# Patient Record
Sex: Female | Born: 1950 | Race: White | Hispanic: No | Marital: Married | Smoking: Never smoker
Health system: Southern US, Community
[De-identification: ages and names within clinical notes are randomized; demographics above are authoritative.]

## PROBLEM LIST (undated history)

## (undated) DIAGNOSIS — I10 Essential (primary) hypertension: Secondary | ICD-10-CM

## (undated) DIAGNOSIS — R3129 Other microscopic hematuria: Secondary | ICD-10-CM

## (undated) DIAGNOSIS — E785 Hyperlipidemia, unspecified: Secondary | ICD-10-CM

## (undated) HISTORY — PX: HAND SURGERY: SHX662

## (undated) HISTORY — DX: Essential (primary) hypertension: I10

## (undated) HISTORY — DX: Hyperlipidemia, unspecified: E78.5

## (undated) HISTORY — PX: CHOLECYSTECTOMY: SHX55

## (undated) HISTORY — PX: TUBAL LIGATION: SHX77

## (undated) HISTORY — DX: Other microscopic hematuria: R31.29

---

## 1998-12-08 ENCOUNTER — Other Ambulatory Visit: Admission: RE | Admit: 1998-12-08 | Discharge: 1998-12-08 | Payer: Self-pay | Admitting: Family Medicine

## 2001-01-13 ENCOUNTER — Inpatient Hospital Stay (HOSPITAL_COMMUNITY): Admission: EM | Admit: 2001-01-13 | Discharge: 2001-01-14 | Payer: Self-pay | Admitting: *Deleted

## 2001-01-13 ENCOUNTER — Encounter: Payer: Self-pay | Admitting: Family Medicine

## 2001-01-14 ENCOUNTER — Encounter: Payer: Self-pay | Admitting: Internal Medicine

## 2001-04-13 ENCOUNTER — Other Ambulatory Visit: Admission: RE | Admit: 2001-04-13 | Discharge: 2001-04-13 | Payer: Self-pay | Admitting: Family Medicine

## 2006-01-02 ENCOUNTER — Other Ambulatory Visit: Admission: RE | Admit: 2006-01-02 | Discharge: 2006-01-02 | Payer: Self-pay | Admitting: Family Medicine

## 2007-05-31 ENCOUNTER — Encounter: Admission: RE | Admit: 2007-05-31 | Discharge: 2007-05-31 | Payer: Self-pay | Admitting: Family Medicine

## 2007-06-13 ENCOUNTER — Encounter: Admission: RE | Admit: 2007-06-13 | Discharge: 2007-06-13 | Payer: Self-pay | Admitting: Neurosurgery

## 2010-11-16 NOTE — H&P (Signed)
[place]. [facility]  Patient:    Marissa Leach, Marissa Leach                       MRN: [id number] Adm. Date:  [id number] Attending:  Cala Bradford CC:         Stacie [place]. Cliffton Asters, M.D., [facility]   History and Physical  DATE OF BIRTH: Jul 24, 1950  PROBLEM LIST:  1. Chest pain, rule out pulmonary emboli, rule out myocardial infarction.  2. Uncontrolled hypertension.  3. Type 2 diabetes mellitus for three years.  4. Dyslipidemia for three years.  5. Perimenopausal.  6. Obesity.  7. Status post bilateral tubal ligation in 1993.  CHIEF COMPLAINT: Chest pain.  HISTORY OF PRESENT ILLNESS: Marissa Leach is a very pleasant 60 year old female, who presents with complaint of substernal chest tightness.  The patient started developing some mid upper chest tightness yesterday about 3 p.m.  The onset of symptoms was in the context of very light exercise.  The chest tightness lasted for about 15 minutes and resolved without having to stop and rest.  The patient describes how this tightness radiated to the left side of the neck and jaw.  No diaphoresis, nausea, vomiting, or palpitations.  When Marissa Leach went to bed still she felt somewhat uncomfortable around the left shoulder.  She thought that she had pulled a muscle and that is why she did not look for medical care.  Early this morning, again she was called to go to work to cover for a co-worker.  About 6:30 a.m. while she was working she had these same symptoms, though the intensity of it was much greater.  The symptoms this time lasted about one hour.  The patient reports complete resolution of her symptoms after nitroglycerin was given.  Marissa Leach denies any previous history of similar symptoms or angina.  No previous heartburn symptoms.  She does exercise at least three to four times a week.  She works out on a treadmill and does a lot f aerobics.  She has lost about 40 pounds following a strict diet  and intensive exercise over the last three to four months.  The patient recently came back from a cruise.  She had a flight of about three hours.  She noticed bilateral ankle swelling afterward.  No fever, no chills, no shortness of breath, no cough, no PND, no rhinorrhea, no headache, no syncope.  No skin rash.  No vaginal discharge.  No urinary symptoms.  No melena, tarry stools, or bright red blood per rectum.  No night sweats. Weight loss has been reported as described previously.  PAST MEDICAL HISTORY: As per problem list.  ALLERGIES: None.  MEDICATIONS:  1. Accupril 200 mg p.o. q.d.  2. Lipitor 20 mg p.o. q.d.  3. Glucophage 1000 mg p.o. b.i.d.  SOCIAL HISTORY: The patient is married and has three children, one of which died due to a drowning accident at the age of four.  She does not smoke.  She does not drink.  She works for [employer].  FAMILY HISTORY: The patients father died at the age of 31 due to an acute MI and her mother at the age of 73, also from an acute MI.  The patients grandmother had a stroke.  No diabetes or hypertension in the family.  REVIEW OF SYSTEMS: As per HPI.  PHYSICAL EXAMINATION:  VITAL SIGNS: Temperature 98.3 degrees, blood pressure 151/87, heart rate 76, respiration  rate 20.  Oxygen saturation 99% on room air.  HEENT: Normocephalic, atraumatic.  Sclerae nonicteric.  Conjunctivae within normal limits.  PERRLA.  EOMI.  Funduscopic examination negative for papilledema or hemorrhage.  TMs within normal limits.  Oropharynx clear. Mucous membranes moist.  NECK: Supple.  No JVD, no bruits, no adenopathy, no thyromegaly.  LUNGS: Clear to auscultation bilaterally without crackles, wheezes.  Good air movement bilaterally.  CARDIAC: Irregular rate and rhythm with 1/6 systolic ejection murmur at the left lower sternal border.  No S3, no rubs.  ABDOMEN: Slightly obese, nontender, nondistended, bowel sounds present;  no hepatosplenomegaly; no rebound, no guarding, no masses, no bruits.  GU: Within normal limits.  BREAST: Within normal limits.  RECTAL: Examination not done.  EXTREMITIES: No edema, no clubbing, no cyanosis.  No cord sign.  No tenderness.  Pulses 2+ bilaterally.  NEUROLOGIC: Alert and oriented x 3.  Strength 5/5 in all extremities.  DTRs 3/5 in all extremities.  Cranial nerves 2-12 intact.  Sensory intact.  Plantar reflexes downgoing bilaterally.  LABORATORY DATA: EKG, normal sinus rhythm with ventricular rate of 87; no Q waves; no ST segment changes; poor R wave progression between V1 and V4; no previous EKG available to compare.  Chest x-ray, negative.  Troponin I less than 0.01.  CK 56, CK-MB 1.5.  Sodium 138, potassium 4.0, chloride 105, CO2 27, BUN 9, creatinine 0.6, glucose 131.  LFTs within normal limits.  Hemoglobin 13.0, MCV 33, WBC 10.7; absolute neutrophil count 8.5; platelets 378,000.  ASSESSMENT/PLAN:  1. Chest pain, rule out pulmonary embolus, rule out myocardial infarction.     Given the history of presentation, the differential diagnosis includes     pulmonary emboli, cardiac ischemic, musculoskeletal source of pain.  Other     etiologies less likely are an abdominal process like gallbladder disease.     There is no evidence of bronchospasm or pneumonia at this point by     clinical symptoms or physical examination.  The patient denies any     gastroesophageal reflux disease symptoms in the past.  First we will go     ahead and obtain a chest CT scan and lower extremity CT scan to rule out     deep vein thrombosis and pulmonary emboli.  If there is no evidence of     thromboembolic disease, a cardiac work-up will be pursued.  The patient     will be admitted regardless to a telemetry bed.  The anticoagulation     therapy will be based on the results of the CAT scan of the chest and     lower extremities.  The first sets of cardiac enzymes are completely      negative.  The electrocardiogram is a nondiagnostic one.  The patients      cardiac risk factors are significant and include diabetes, hypertension,     hyperlipidemia, and family medical history.  For this reason, cardiac     enzyme series and electrocardiograms will be obtained if the work-up for     pulmonary embolus is negative.  If the cardiac enzymes are negative, a     stress treadmill Cardiolite will be performed tomorrow morning.  In the     meantime, I will use a beta-blocker, aspirin, Plavix, nitroglycerin paste,     and oxygen for heart protection.  As described above, anticoagulation     therapy will not be used at this point.  I discussed management of this  case with Dr. Armanda Magic, who agrees with the assessment and plan.  2. Uncontrolled hypertension.  The initial blood pressure was in the mild     range of uncontrolled hypertension.  A repeat blood pressure shows an     improvement of this problem with blood pressure of 113/65.  I believe the     initial uncontrolled hypertension was likely to be associated with the     stress of the emergency room visit.  For now, we will continue the     nitroglycerin, beta-blocker, and ACE inhibitor as described above.  We     will monitor the patients blood pressure and decide about further     adjustments.  3. Type 2 diabetes mellitus.  Glucophage will be held due to the intravenous     dye protocol.  The patient reports a very good glycemic control since she     started the program of a strict diet and exercise about three to four     months ago.  For now, we will use Amaryl and sliding-scale insulin for     glycemic control.  4. Hypercholesterolemia.  Will continue statin therapy.  The liver function     tests are normal.  5. Obesity.  Although the patient has lost 40 pounds in the last three     months, this weight loss seems to be intentional by the exercise and     strict diet described in the History of Present Illness  section.  The     patient denies any night sweats, gastrointestinal, or genitourinary     symptoms.  For now we will guaiac the stools.  Although the patient seems     to be in perimenopausal phase, I believe that a work-up for malignancy is     not warranted.  We encouraged Marissa Leach to continue with the current     weight loss program since still there is plenty of room before we reach     the ideal weight for this patient. DD:  01/13/01 TD:  01/13/01 Job: [id number]

## 2011-08-29 ENCOUNTER — Ambulatory Visit (INDEPENDENT_AMBULATORY_CARE_PROVIDER_SITE_OTHER): Payer: 59 | Admitting: Family Medicine

## 2011-08-29 VITALS — BP 137/74 | HR 98 | Temp 99.7°F | Resp 16 | Ht 66.5 in | Wt 230.0 lb

## 2011-08-29 DIAGNOSIS — J019 Acute sinusitis, unspecified: Secondary | ICD-10-CM

## 2011-08-29 DIAGNOSIS — E119 Type 2 diabetes mellitus without complications: Secondary | ICD-10-CM

## 2011-08-29 DIAGNOSIS — E7800 Pure hypercholesterolemia, unspecified: Secondary | ICD-10-CM

## 2011-08-29 DIAGNOSIS — E782 Mixed hyperlipidemia: Secondary | ICD-10-CM | POA: Insufficient documentation

## 2011-08-29 DIAGNOSIS — J329 Chronic sinusitis, unspecified: Secondary | ICD-10-CM

## 2011-08-29 MED ORDER — CEFDINIR 300 MG PO CAPS: 300.0000 mg | ORAL_CAPSULE | Freq: Two times a day (BID) | ORAL | Status: AC

## 2011-08-29 NOTE — Progress Notes (Signed)
  Patient Name: Marissa Leach Date of Birth: 1951-02-22 Medical Record Number: [id number] Gender: female Date of Encounter: 08/29/2011  History of Present Illness:  Marissa Leach is a 61 y.o. very pleasant female patient who presents with the following:  Here today with illness.  Has been sick since christmas with right sided sinus and tooth pain that has never gone away. Right side nasal mucus is thick and discolored.  Low grade fever, no cough.  No ST.  No GI symptoms.  Is now off her BP meds due to weight loss- she is very pleased with her progress  Patient Active Problem List  Diagnoses  . High cholesterol  . Diabetes mellitus   Past Medical History  Diagnosis Date  . Hypertension     resolved by wt loss   Past Surgical History  Procedure Date  . Cholecystectomy   . Tubal ligation    History  Substance Use Topics  . Smoking status: Never Smoker   . Smokeless tobacco: Never Used  . Alcohol Use: No   Family History  Problem Relation Age of Onset  . Heart disease Mother   . Heart disease Father    No Known Allergies  Medication list has been reviewed and updated.  Review of Systems: As per HPI- otherwise negative.  Physical Examination: Filed Vitals:   08/29/11 1826  BP: 137/74  Pulse: 98  Temp: 99.7 F (37.6 C)  Resp: 16  Height: 5' 6.5" (1.689 m)  Weight: 230 lb (104.327 kg)    Body mass index is 36.57 kg/(m^2).  GEN: WDWN, NAD, Non-toxic, A & O x 3 HEENT: Atraumatic, Normocephalic. Neck supple. No masses, No LAD. Right frontal sinus TTP, TM wnl, oropharynx wnl Ears and Nose: No external deformity. CV: RRR, No M/G/R. No JVD. No thrill. No extra heart sounds. PULM: CTA B, no wheezes, crackles, rhonchi. No retractions. No resp. distress. No accessory muscle use. EXTR: No c/c/e NEURO Normal gait.  PSYCH: Normally interactive. Conversant. Not depressed or anxious appearing.  Calm demeanor.    Assessment and Plan: 1. Sinusitis  cefdinir (OMNICEF)  300 MG capsule  2. High cholesterol    3. Diabetes mellitus      Treat sinusitis as above. She will return for a fasting DM recheck in the next month or so.  Continuing to lose weight about one pound a week through lifestyle change- keep up the good work.   Patient (or parent if minor) instructed to return to clinic or call if not better in 3 day(s).

## 2012-02-06 ENCOUNTER — Other Ambulatory Visit: Payer: Self-pay | Admitting: Family Medicine

## 2012-02-21 ENCOUNTER — Encounter: Payer: Self-pay | Admitting: Physician Assistant

## 2012-02-21 ENCOUNTER — Ambulatory Visit (INDEPENDENT_AMBULATORY_CARE_PROVIDER_SITE_OTHER): Payer: 59 | Admitting: Physician Assistant

## 2012-02-21 VITALS — BP 142/80 | HR 84 | Temp 98.3°F | Resp 16 | Ht 65.5 in | Wt 228.2 lb

## 2012-02-21 DIAGNOSIS — E782 Mixed hyperlipidemia: Secondary | ICD-10-CM

## 2012-02-21 DIAGNOSIS — J309 Allergic rhinitis, unspecified: Secondary | ICD-10-CM | POA: Insufficient documentation

## 2012-02-21 DIAGNOSIS — Z23 Encounter for immunization: Secondary | ICD-10-CM

## 2012-02-21 DIAGNOSIS — R3129 Other microscopic hematuria: Secondary | ICD-10-CM | POA: Insufficient documentation

## 2012-02-21 DIAGNOSIS — E119 Type 2 diabetes mellitus without complications: Secondary | ICD-10-CM

## 2012-02-21 DIAGNOSIS — I10 Essential (primary) hypertension: Secondary | ICD-10-CM

## 2012-02-21 LAB — COMPREHENSIVE METABOLIC PANEL
ALT: 14 U/L (ref 0–35)
AST: 16 U/L (ref 0–37)
BUN: 19 mg/dL (ref 6–23)
Calcium: 10 mg/dL (ref 8.4–10.5)
Glucose, Bld: 300 mg/dL — ABNORMAL HIGH (ref 70–99)
Potassium: 5 mEq/L (ref 3.5–5.3)
Sodium: 136 mEq/L (ref 135–145)
Total Bilirubin: 0.5 mg/dL (ref 0.3–1.2)
Total Protein: 6.9 g/dL (ref 6.0–8.3)

## 2012-02-21 LAB — LIPID PANEL: LDL Cholesterol: 182 mg/dL — ABNORMAL HIGH (ref 0–99)

## 2012-02-21 LAB — GLUCOSE, POCT (MANUAL RESULT ENTRY): POC Glucose: 276 mg/dl — AB (ref 70–99)

## 2012-02-21 MED ORDER — SITAGLIPTIN PHOS-METFORMIN HCL 50-1000 MG PO TABS: 1.0000 | ORAL_TABLET | Freq: Every day | ORAL | Status: DC

## 2012-02-21 MED ORDER — FENOFIBRATE 145 MG PO TABS: 145.0000 mg | ORAL_TABLET | Freq: Every day | ORAL | Status: DC

## 2012-02-21 NOTE — Progress Notes (Signed)
Subjective:    Patient ID: Marissa Leach, female    DOB: [birth year], 61 y.o.   MRN: [id number]  HPI 61 y.o. year old female presents for evaluation of diabetes type 2, hyperlipidemia.  Also needs refills of Janument and Tricor. Has been very stressed x 6 months and feels like she's swollen and gained weight, and expects her glucose will be elevated today.  She reports that she was well controlled on her meds before she got stressed, so wants to continue that and "get myself back on track."  Prior to Admission medications   Medication Sig Start Date End Date Taking? Authorizing Provider  aspirin 81 MG tablet Take 160 mg by mouth 2 (two) times daily.    Yes Historical Provider, MD  Calcium Carbonate-Vitamin D (CALCIUM 600 + D PO) Take 1,200 mg by mouth daily.   Yes Historical Provider, MD  cyanocobalamin 100 MCG tablet Take 100 mcg by mouth daily. Patient not sure of dosage.   Yes Historical Provider, MD  fenofibrate (TRICOR) 145 MG tablet Take 145 mg by mouth daily.   Yes Historical Provider, MD  fish oil-omega-3 fatty acids 1000 MG capsule Take 1 g by mouth daily.   Yes Historical Provider, MD  magnesium 30 MG tablet Take by mouth daily.   Yes Historical Provider, MD  sitaGLIPtan-metformin (JANUMET) 50-1000 MG per tablet Take 1 tablet by mouth daily. NEEDS OFFICE VISIT 02/06/12  Yes Morrell Riddle, PA-C    No Known Allergies  Checks home glucose NEVER. Doesn't know if she experience symptoms with hypoglycemia. Vision gets blurry when it gets high. does not perform daily foot exam. Last dental visit was 6 months ago. Next visit is this fall. Last eye exam was within the last 1-2 years.  Gets a card to schedule. has not received a pneumococcal vaccine. is not current with influenza vaccination. Gets them intermittently at work.  Past Medical History  Diagnosis Date  . Hypertension     resolved by wt loss  . Diabetes mellitus   . Hyperlipidemia   . Microhematuria     Family History    Problem Relation Age of Onset  . Heart disease Mother   . Heart disease Father   . Diabetes Son 62    Review of Systems Denies chest pain, shortness of breath, HA, dizziness, vision change, nausea, vomiting, diarrhea, constipation, melena, hematochezia, dysuria, increased urinary urgency or frequency, increased hunger or thirst, unintentional weight change, unexplained arthralgias, rash. She describes cramping in the ankles/feet and right medial thigh cramping at night.  They are strong enough to wake him up, and are brief.     Objective:   Physical Exam  Blood pressure 142/80, pulse 84, temperature 98.3 F (36.8 C), temperature source Oral, resp. rate 16, height 5' 5.5" (1.664 m), weight 228 lb 3.2 oz (103.511 kg), SpO2 97.00%. Body mass index is 37.40 kg/(m^2). Well-developed, well nourished WF who is awake, alert and oriented, in NAD. HEENT: [place]/AT, PERRL, EOMI.  Sclera and conjunctiva are clear.  EAC are patent, TMs are normal in appearance. Nasal mucosa is pink and moist. OP is clear. Neck: supple, non-tender, no lymphadenopathy, thyromegaly. Heart: RRR, no murmur Lungs: CTA Extremities: no cyanosis, clubbing or edema. Skin: warm and dry without rash.  See diabetic foot exam.     Assessment & Plan:   1. AR (allergic rhinitis)    2. Type II or unspecified type diabetes mellitus without mention of complication, not stated as uncontrolled  POCT glucose (manual  entry), POCT glycosylated hemoglobin (Hb A1C), Comprehensive metabolic panel, Pneumococcal polysaccharide vaccine 23-valent greater than or equal to 2yo subcutaneous/IM, Microalbumin, urine, sitaGLIPtan-metformin (JANUMET) 50-1000 MG per tablet  3. Mixed hyperlipidemia  Lipid panel, fenofibrate (TRICOR) 145 MG tablet   Restart her healthy lifestyle and get back in control.  If she's not there in 3 months we'll increase the Janumet to BID.

## 2012-02-21 NOTE — Patient Instructions (Signed)
Please check your feet each day (the soles and between the toes, too!).  If you develop any areas that are red or swollen, please come in for evaluation.

## 2012-02-22 ENCOUNTER — Encounter: Payer: Self-pay | Admitting: Physician Assistant

## 2012-02-24 NOTE — Progress Notes (Signed)
Follow-up appointment made with Dr. Audria Nine for November.

## 2012-05-27 ENCOUNTER — Ambulatory Visit: Payer: 59 | Admitting: Family Medicine

## 2012-06-26 ENCOUNTER — Ambulatory Visit (INDEPENDENT_AMBULATORY_CARE_PROVIDER_SITE_OTHER): Payer: 59 | Admitting: Family Medicine

## 2012-06-26 ENCOUNTER — Encounter: Payer: Self-pay | Admitting: Family Medicine

## 2012-06-26 VITALS — BP 172/88 | HR 79 | Temp 98.2°F | Resp 18 | Ht 66.5 in | Wt 224.0 lb

## 2012-06-26 DIAGNOSIS — E1169 Type 2 diabetes mellitus with other specified complication: Secondary | ICD-10-CM

## 2012-06-26 DIAGNOSIS — IMO0001 Reserved for inherently not codable concepts without codable children: Secondary | ICD-10-CM

## 2012-06-26 DIAGNOSIS — E782 Mixed hyperlipidemia: Secondary | ICD-10-CM

## 2012-06-26 DIAGNOSIS — I10 Essential (primary) hypertension: Secondary | ICD-10-CM

## 2012-06-26 DIAGNOSIS — Z23 Encounter for immunization: Secondary | ICD-10-CM

## 2012-06-26 DIAGNOSIS — B373 Candidiasis of vulva and vagina: Secondary | ICD-10-CM

## 2012-06-26 LAB — ALT: ALT: 13 U/L (ref 0–35)

## 2012-06-26 LAB — BASIC METABOLIC PANEL
CO2: 27 mEq/L (ref 19–32)
Calcium: 9.6 mg/dL (ref 8.4–10.5)
Glucose, Bld: 422 mg/dL — ABNORMAL HIGH (ref 70–99)
Potassium: 5.1 mEq/L (ref 3.5–5.3)

## 2012-06-26 LAB — LIPID PANEL
HDL: 37 mg/dL — ABNORMAL LOW (ref >39)
Total CHOL/HDL Ratio: 7.7 Ratio

## 2012-06-26 LAB — POCT WET PREP WITH KOH: Trichomonas, UA: NEGATIVE

## 2012-06-26 MED ORDER — SITAGLIPTIN PHOS-METFORMIN HCL 50-1000 MG PO TABS: 1.0000 | ORAL_TABLET | Freq: Two times a day (BID) | ORAL | Status: DC

## 2012-06-26 MED ORDER — LISINOPRIL-HYDROCHLOROTHIAZIDE 20-12.5 MG PO TABS: 1.0000 | ORAL_TABLET | Freq: Every day | ORAL | Status: DC

## 2012-06-26 MED ORDER — FLUCONAZOLE 150 MG PO TABS: ORAL_TABLET | ORAL | Status: DC

## 2012-06-26 NOTE — Progress Notes (Signed)
S:  This 61 y.o. Cauc female has Type II DM and HTN; she was on BP meds but they were discontinued some time ago. BP was well controlled when she was taking Lisinopril-HCTZ. She does not check FSBS ("I don't have a good reason why; sometimes I just don't care"). Diabetes medication is taken once a day (had been twice a day in the past but dose was lowered for "some unknown reason").   Financial stressors have just started to subside; husband finally got a job (entry- level) so things should get better in 2014. Son gave pt a "rebounder" trampoline for Christmas; she has done one 5-minute workout and has burning and discomfort in thigh muscles today. She plans to continue w/ short workouts and gradually increase as she builds up endurance and muscle pain subsides.  Pt c/o vaginal itching and some discharge; denies odor, dysuria, abnormal bleeding or pelvic pain.  ROS: Negative for abnormal weight change, fatigue, vision disturbances, CP or tightness, palpitations, edema, SOB or DOE, cough, joint or muscle pain, HA, dizziness, numbness, weakness or syncope.  O:  Filed Vitals:   06/26/12 0917  BP: 172/88  Pulse: 79  Temp: 98.2 F (36.8 C)  Resp: 18   GEN: In NAD: WN,WE. HEENT: [place]/AT; EOMI w/ clear conj/ sclerae. EACs normal Oroph unremarkable. NECK: Supple w/o LAN or TMG. COR: RRR. Trace pedal edema. LUNGS: Normal resp rate and effort. MS: MAEs w/o difficulty; NEURO: A&O x 3; CNs intact. Nonfocal.  A1c > 14%  A/P: 1. Type II or unspecified type diabetes mellitus without mention of complication, uncontrolled  Basic metabolic panel, Lipid panel, sitaGLIPtan-metformin (JANUMET) 50-1000 MG per tablet  1 tablet twice a day w/ meals Improve nutrition and stay active  2. Candidiasis of vagina  POCT Wet Prep with KOH- Positive for Yeast   3. Combined hyperlipidemia associated with type 2 diabetes mellitus  ALT, Lipid panel  4. HTN, goal below 140/80  Resume ACEI (Lisinopril-HCTZ 20-12.5 mg 1 tab  daily)  5. Need for influenza vaccination  Flu vaccine greater than or equal to 3yo preservative free IM

## 2012-06-26 NOTE — Patient Instructions (Addendum)
1800 Calorie Diet for Diabetes Meal Planning The 1800 calorie diet is designed for eating up to 1800 calories each day. Following this diet and making healthy meal choices can help improve overall health. This diet controls blood sugar (glucose) levels and can also help lower blood pressure and cholesterol. SERVING SIZES Measuring foods and serving sizes helps to make sure you are getting the right amount of food. The list below tells how big or small some common serving sizes are:  1 oz.........4 stacked dice.  3 oz........Marland KitchenDeck of cards.  1 tsp.......Marland KitchenTip of little finger.  1 tbs......Marland KitchenMarland KitchenThumb.  2 tbs.......Marland KitchenGolf ball.   cup......Marland KitchenHalf of a fist.  1 cup.......Marland KitchenA fist. GUIDELINES FOR CHOOSING FOODS The goal of this diet is to eat a variety of foods and limit calories to 1800 each day. This can be done by choosing foods that are low in calories and fat. The diet also suggests eating small amounts of food frequently. Doing this helps control your blood glucose levels so they do not get too high or too low. Each meal or snack may include a protein food source to help you feel more satisfied and to stabilize your blood glucose. Try to eat about the same amount of food around the same time each day. This includes weekend days, travel days, and days off work. Space your meals about 4 to 5 hours apart and add a snack between them if you wish.  For example, a daily food plan could include breakfast, a morning snack, lunch, dinner, and an evening snack. Healthy meals and snacks include whole grains, vegetables, fruits, lean meats, poultry, fish, and dairy products. As you plan your meals, select a variety of foods. Choose from the bread and starch, vegetable, fruit, dairy, and meat/protein groups. Examples of foods from each group and their suggested serving sizes are listed below. Use measuring cups and spoons to become familiar with what a healthy portion looks like. Bread and Starch Each serving  equals 15 grams of carbohydrates.  1 slice bread.   bagel.   cup cold cereal (unsweetened).   cup hot cereal or mashed potatoes.  1 small potato (size of a computer mouse).   cup cooked pasta or rice.   English muffin.  1 cup broth-based soup.  3 cups of popcorn.  4 to 6 whole-wheat crackers.   cup cooked beans, peas, or corn. Vegetable Each serving equals 5 grams of carbohydrates.   cup cooked vegetables.  1 cup raw vegetables.   cup tomato or vegetable juice. Fruit Each serving equals 15 grams of carbohydrates.  1 small apple or orange.  1 cup watermelon or strawberries.   cup applesauce (no sugar added).  2 tbs raisins.   banana.   cup canned fruit, packed in water, its own juice, or sweetened with a sugar substitute.   cup unsweetened fruit juice. Dairy Each serving equals 12 to 15 grams of carbohydrates.  1 cup fat-free milk.  6 oz artificially sweetened yogurt or plain yogurt.  1 cup low-fat buttermilk.  1 cup soy milk.  1 cup almond milk. Meat/Protein  1 large egg.  2 to 3 oz meat, poultry, or fish.   cup low-fat cottage cheese.  1 tbs peanut butter.  1 oz low-fat cheese.   cup tuna in water.   cup tofu. Fat  1 tsp oil.  1 tsp trans-fat-free margarine.  1 tsp butter.  1 tsp mayonnaise.  2 tbs avocado.  1 tbs salad dressing.  1 tbs cream cheese.  2 tbs sour cream. SAMPLE 1800 CALORIE DIET PLAN Breakfast   cup unsweetened cereal (1 carb serving).  1 cup fat-free milk (1 carb serving).  1 slice whole-wheat toast (1 carb serving).   small banana (1 carb serving).  1 scrambled egg.  1 tsp trans-fat-free margarine. Lunch  Tuna sandwich.  2 slices whole-wheat bread (2 carb servings).   cup canned tuna in water, drained.  1 tbs reduced fat mayonnaise.  1 stalk celery, chopped.  2 slices tomato.  1 lettuce leaf.  1 cup carrot sticks.  24 to 30 seedless grapes (2 carb  servings).  6 oz light yogurt (1 carb serving). Afternoon Snack  3 graham cracker squares (1 carb serving).  Fat-free milk, 1 cup (1 carb serving).  1 tbs peanut butter. Dinner  3 oz salmon, broiled with 1 tsp oil.  1 cup mashed potatoes (2 carb servings) with 1 tsp trans-fat-free margarine.  1 cup fresh or frozen green beans.  1 cup steamed asparagus.  1 cup fat-free milk (1 carb serving). Evening Snack  3 cups air-popped popcorn (1 carb serving).  2 tbs parmesan cheese sprinkled on top. MEAL PLAN Use this worksheet to help you make a daily meal plan based on the 1800 calorie diet suggestions. If you are using this plan to help you control your blood glucose, you may interchange carbohydrate-containing foods (dairy, starches, and fruits). Select a variety of fresh foods of varying colors and flavors. The total amount of carbohydrate in your meals or snacks is more important than making sure you include all of the food groups every time you eat. Choose from the following foods to build your day's meals:  8 Starches.  4 Vegetables.  3 Fruits.  2 Dairy.  6 to 7 oz Meat/Protein.  Up to 4 Fats. Your dietician can use this worksheet to help you decide how many servings and which types of foods are right for you. BREAKFAST Food Group and Servings / Food Choice Starch ________________________________________________________ Dairy _________________________________________________________ Fruit _________________________________________________________ Meat/Protein __________________________________________________ Fat ___________________________________________________________ LUNCH Food Group and Servings / Food Choice Starch ________________________________________________________ Meat/Protein __________________________________________________ Vegetable _____________________________________________________ Fruit  _________________________________________________________ Dairy _________________________________________________________ Fat ___________________________________________________________ Marissa Leach Food Group and Servings / Food Choice Starch ________________________________________________________ Meat/Protein __________________________________________________ Fruit __________________________________________________________ Dairy _________________________________________________________ Marissa Leach Food Group and Servings / Food Choice Starch _________________________________________________________ Meat/Protein ___________________________________________________ Dairy __________________________________________________________ Vegetable ______________________________________________________ Fruit ___________________________________________________________ Fat ____________________________________________________________ Marissa Leach Food Group and Servings / Food Choice Fruit __________________________________________________________ Meat/Protein ___________________________________________________ Dairy __________________________________________________________ Starch _________________________________________________________ DAILY TOTALS Starch ____________________________ Vegetable _________________________ Fruit _____________________________ Dairy _____________________________ Meat/Protein______________________ Fat _______________________________ Document Released: 01/07/2005 Document Revised: 09/09/2011 Document Reviewed: 05/03/2011 ExitCare Patient Information 2013 [place], [address].    It is important that you check your blood sugar at least once a day (sometimes 2 times daily) to self manage your diabetes. It helps [place] to know what your blood sugars are when you are at home and living your day-to-day life. You need to know what your sugars are on "sick days" and before you  exercise. I have prescribed a meter with strips so you can keep track of your blood sugars and be responsible for self- monitoring.   Your next visit will be for Diabetes follow-up and your physical will be done later in 2014.

## 2012-07-02 ENCOUNTER — Encounter: Payer: Self-pay | Admitting: Family Medicine

## 2012-07-02 DIAGNOSIS — Z9189 Other specified personal risk factors, not elsewhere classified: Secondary | ICD-10-CM | POA: Insufficient documentation

## 2012-07-02 DIAGNOSIS — IMO0001 Reserved for inherently not codable concepts without codable children: Secondary | ICD-10-CM | POA: Insufficient documentation

## 2012-07-02 NOTE — Progress Notes (Signed)
Quick Note:  Please call pt and advise that the following labs are abnormal... Your blood sugar was high; we discussed better management of your Diabetes. As a result of poor control of this disease, your cholesterol and triglycerides are way above normal. Currently , you are not on medication for elevated lipids; Try to work on improving your nutrition/ food choices and getting control of Diabetes. Take all medications as prescribed and stay active with regular exercise (at least 3-4 days a week). When you return for next visit, lipid blood work will be rechecked. Your numbers 4 months ago were not as high.  Try Fish Oil capsule 1200 mg 1 capsule daily (if you do not have a fish allergy) to lower lipid levels.  Copy to pt. ______

## 2012-07-03 ENCOUNTER — Encounter: Payer: Self-pay | Admitting: Radiology

## 2012-07-04 ENCOUNTER — Encounter: Payer: Self-pay | Admitting: Radiology

## 2012-07-20 ENCOUNTER — Other Ambulatory Visit: Payer: Self-pay | Admitting: Family Medicine

## 2012-07-23 ENCOUNTER — Other Ambulatory Visit: Payer: Self-pay | Admitting: Family Medicine

## 2012-07-23 NOTE — Telephone Encounter (Signed)
Pt called stated she had  called over a week ago for med refill for yeast infection. Pt stated previously med had not totally  cleared infection. Can something else be called in, condition getting worse. Please let pt know asap. [phone] [facility] [address]

## 2012-07-23 NOTE — Telephone Encounter (Signed)
She needs to come in if that didn't take care of it. It could be something more going on.

## 2012-07-24 ENCOUNTER — Telehealth: Payer: Self-pay

## 2012-07-24 NOTE — Telephone Encounter (Signed)
She must come in for this. I have called her to advise. Advised her to come in, she is angry. She states she has been treated, I advised not normal for meds not to work and she does need to be seen. She disconnected call.

## 2012-07-24 NOTE — Telephone Encounter (Signed)
Pt states this is the forth day of calling for this medication:  fluconazole (DIFLUCAN) 150 MG tablet  bleeeeeeding from scratching so bad this is a really bad yeast infection   Needs help   [facility] [address] [phone] (H)

## 2012-07-26 ENCOUNTER — Ambulatory Visit (INDEPENDENT_AMBULATORY_CARE_PROVIDER_SITE_OTHER): Payer: 59 | Admitting: Physician Assistant

## 2012-07-26 ENCOUNTER — Telehealth: Payer: Self-pay | Admitting: *Deleted

## 2012-07-26 VITALS — BP 134/80 | HR 94 | Temp 98.7°F | Resp 16 | Ht 66.5 in | Wt 223.0 lb

## 2012-07-26 DIAGNOSIS — B373 Candidiasis of vulva and vagina: Secondary | ICD-10-CM

## 2012-07-26 DIAGNOSIS — Z01419 Encounter for gynecological examination (general) (routine) without abnormal findings: Secondary | ICD-10-CM

## 2012-07-26 DIAGNOSIS — N898 Other specified noninflammatory disorders of vagina: Secondary | ICD-10-CM

## 2012-07-26 DIAGNOSIS — L293 Anogenital pruritus, unspecified: Secondary | ICD-10-CM

## 2012-07-26 LAB — POCT WET PREP WITH KOH
Clue Cells Wet Prep HPF POC: NEGATIVE
Trichomonas, UA: NEGATIVE
Yeast Wet Prep HPF POC: NEGATIVE

## 2012-07-26 MED ORDER — FLUCONAZOLE 100 MG PO TABS: 100.0000 mg | ORAL_TABLET | Freq: Every day | ORAL | Status: DC

## 2012-07-26 MED ORDER — FLUCONAZOLE 150 MG PO TABS: 150.0000 mg | ORAL_TABLET | Freq: Once | ORAL | Status: DC

## 2012-07-26 MED ORDER — TERCONAZOLE 0.4 % VA CREA: 1.0000 | TOPICAL_CREAM | Freq: Every day | VAGINAL | Status: DC

## 2012-07-26 NOTE — Patient Instructions (Addendum)
We will treat you for 10days - if you still have symptoms in 10days please recheck - if you are symptoms free at 20 days - great - that means that the infection is successfully treated.  If you develop symptoms after 20 days please use the Diflucan 150mg  that is at the pharmacy.

## 2012-07-26 NOTE — Telephone Encounter (Signed)
error 

## 2012-07-26 NOTE — Progress Notes (Signed)
   [address], [place] [address]   Phone [phone]  Subjective:    Patient ID: Marissa Leach, female    DOB: 02-Mar-1951, 62 y.o.   MRN: [id number]  HPI  Pt presents to clinic with continued vaginal itching and irritation.  The symptoms started in Nov and she used a lot of OTC preparation without much help. She was seen in Dec and was given medication that helped but did not feel like the symptoms resolved.  Currently she has a slimy feeling in her vagina and severe external itching to the point where she is bleeding from scratching.  She has had no new sexual exposures.  She knows her DM is out of control but she does not check her glucose.  Pt also needs a PAP test at her last appt there was not time for a CPE and she would like to have it done today since we had to do a pelvic exam.  Review of Systems  Genitourinary: Positive for vaginal discharge.       Itching       Objective:   Physical Exam  Vitals reviewed. Constitutional: She is oriented to person, place, and time. She appears well-developed and well-nourished.  Pulmonary/Chest: Effort normal.  Genitourinary: Pelvic exam was performed with patient supine. No labial fusion. There is tenderness on the right labia. There is no rash, lesion or injury on the right labia. There is tenderness on the left labia. There is no rash, lesion or injury on the left labia. Vaginal discharge (thick vaginal discharge) found.       External genitalia red swollen and irritated from above her clitoris to behind her anus.  Neurological: She is alert and oriented to person, place, and time.  Skin: Skin is warm and dry.  Psychiatric: She has a normal mood and affect. Her behavior is normal. Judgment and thought content normal.   Results for orders placed in visit on 07/26/12  POCT WET PREP WITH KOH      Component Value Range   Trichomonas, UA Negative     Clue Cells Wet Prep HPF POC neg     Epithelial Wet Prep HPF POC 4-9     Yeast Wet Prep  HPF POC neg     Bacteria Wet Prep HPF POC 2+     RBC Wet Prep HPF POC 3-5     WBC Wet Prep HPF POC 4-7     KOH Prep POC Positive           Assessment & Plan:   1. Vaginal itching  POCT Wet Prep with KOH, fluconazole (DIFLUCAN) 150 MG tablet  2. Visit for gynecologic examination  Pap IG and HPV (high risk) DNA detection  3. Yeast vaginitis  fluconazole (DIFLUCAN) 100 MG tablet, terconazole (TERAZOL 7) 0.4 % vaginal cream   Due to the length of time of infection, glucose out of control will treat orally and externally with cream.  Pt to try and stop scratching.  If she is not better after 10d of treatment pt should RTC for recheck.  Pt understands and agrees with the plan.

## 2012-07-27 LAB — PAP IG AND HPV HIGH-RISK

## 2012-07-28 ENCOUNTER — Telehealth: Payer: Self-pay

## 2012-07-28 ENCOUNTER — Encounter: Payer: Self-pay | Admitting: *Deleted

## 2012-07-28 NOTE — Telephone Encounter (Signed)
Pt is calling for lab results 

## 2012-07-29 ENCOUNTER — Telehealth: Payer: Self-pay

## 2012-07-29 NOTE — Telephone Encounter (Signed)
See labs 

## 2012-07-29 NOTE — Telephone Encounter (Signed)
Called patient. Left message. See labs.

## 2012-07-29 NOTE — Telephone Encounter (Signed)
Pt called. Notified of labs.

## 2012-09-07 ENCOUNTER — Other Ambulatory Visit: Payer: Self-pay | Admitting: Physician Assistant

## 2012-09-23 ENCOUNTER — Ambulatory Visit: Payer: 59 | Admitting: Family Medicine

## 2012-10-01 ENCOUNTER — Encounter: Payer: Self-pay | Admitting: Family Medicine

## 2012-11-20 ENCOUNTER — Ambulatory Visit (INDEPENDENT_AMBULATORY_CARE_PROVIDER_SITE_OTHER): Payer: 59 | Admitting: Family Medicine

## 2012-11-20 ENCOUNTER — Encounter: Payer: Self-pay | Admitting: Family Medicine

## 2012-11-20 VITALS — BP 130/68 | HR 77 | Temp 98.0°F | Resp 16 | Ht 66.0 in | Wt 222.8 lb

## 2012-11-20 DIAGNOSIS — G4762 Sleep related leg cramps: Secondary | ICD-10-CM

## 2012-11-20 DIAGNOSIS — R5383 Other fatigue: Secondary | ICD-10-CM

## 2012-11-20 DIAGNOSIS — E559 Vitamin D deficiency, unspecified: Secondary | ICD-10-CM

## 2012-11-20 DIAGNOSIS — R5381 Other malaise: Secondary | ICD-10-CM

## 2012-11-20 DIAGNOSIS — E782 Mixed hyperlipidemia: Secondary | ICD-10-CM

## 2012-11-20 LAB — COMPREHENSIVE METABOLIC PANEL
AST: 23 U/L (ref 0–37)
Albumin: 4.2 g/dL (ref 3.5–5.2)
Alkaline Phosphatase: 46 U/L (ref 39–117)
CO2: 25 mEq/L (ref 19–32)
Glucose, Bld: 250 mg/dL — ABNORMAL HIGH (ref 70–99)
Sodium: 136 mEq/L (ref 135–145)

## 2012-11-20 LAB — LIPID PANEL
Cholesterol: 257 mg/dL — ABNORMAL HIGH (ref 0–200)
Triglycerides: 300 mg/dL — ABNORMAL HIGH (ref <150)

## 2012-11-20 LAB — POCT GLYCOSYLATED HEMOGLOBIN (HGB A1C): Hemoglobin A1C: 11.8

## 2012-11-20 MED ORDER — FENOFIBRATE 145 MG PO TABS: 145.0000 mg | ORAL_TABLET | Freq: Every day | ORAL | Status: DC

## 2012-11-20 MED ORDER — LISINOPRIL-HYDROCHLOROTHIAZIDE 20-12.5 MG PO TABS: 1.0000 | ORAL_TABLET | Freq: Every day | ORAL | Status: DC

## 2012-11-20 MED ORDER — SITAGLIPTIN PHOS-METFORMIN HCL 50-1000 MG PO TABS: ORAL_TABLET | ORAL | Status: DC

## 2012-11-20 MED ORDER — SITAGLIPTIN PHOS-METFORMIN HCL 50-1000 MG PO TABS: 1.0000 | ORAL_TABLET | Freq: Every day | ORAL | Status: DC

## 2012-11-20 NOTE — Patient Instructions (Signed)
Your Diabetes is still not well controlled. You need to take your Diabetes medication twice a day with meals. Try to eat as healthy as you can. I will see you again in 3-4 months. You have to make time to take care of yourself and stay on top of this medical problem.

## 2012-11-20 NOTE — Progress Notes (Signed)
S: This 62 y.o. Cauc female is here for Type II DM and HTN follow-up. Compliance w/ FSBS and nutrition is fair; pt works long hours (12+) at [employer] and often grabs fast food or whatever is convenient. She states the financial stress that she and her husband have been experiencing should be lessened next month since husband has full-time employment now. She denies skipping doses of medication, nocturia, polyphagia or polydipsia or hypoglycemic symptoms. Pt has not had vision evaluation within last 12 months.  She does c/o fatigue and sleep hygiene if poor; she averages <6 hours most nights. She has nocturnal leg cramps and fronts of lower legs ache, especially after long shift at work. Also c/o pain in some of digits of hands. Recalls that grandfather had "crippling arthritis" where his fingers were bent and stuck in one position.  Re: HTN- Taking medication daily; pt not checking BP at home. She denies vision changes, CP or tightness, palpitations, HA, dizziness, facial asymmetry or speech difficulty, numbness or syncope.  Re: Lipid disorder- pt not able to follow a lipid-lowering meal plan (no meal planning at all). Is taking Tricor daily. Voices no problems w/ this medication.   Patient Active Problem List   Diagnosis Date Noted  . Obesity, Class II, BMI 35-39.9, with comorbidity 07/02/2012  . Cardiovascular risk factor 07/02/2012  . AR (allergic rhinitis) 02/21/2012  . Microhematuria   . High cholesterol 08/29/2011  . Diabetes mellitus 08/29/2011    PMHx, Soc Hx and Fam Hx reviewed.   O: Filed Vitals:   11/20/12 0814  BP: 130/68  Pulse: 77  Temp: 98 F (36.7 C)  Resp: 16   GEN: In NAD. WN,WD. HENT: [place]/AT; EOMI w/ clear conj/ sclerae. Fundi difficult to examine. EACs/nose/post ph normal. NECK: No LAN or TMG. No JVD. COR: RRR. No edema. LUNGS: Normal resp rate and effort. No audible wheezes or dyspnea. SKIN: W&D; no rashes or pallor. MS: Hands- Wrists and MCP joints  normal. DIP and PIP joints w/ Heberden's nodes; minimal erythema and tenderness. Good ROM. NEURO: A&O x 3; CNs intact. Nonfocal.  PSYCH- Somewhat flat affect; calm demeanor and conversant. Speech pattern and thought content normal. Judgement intact.   Results for orders placed in visit on 11/20/12  POCT GLYCOSYLATED HEMOGLOBIN (HGB A1C)      Result Value Range   Hemoglobin A1C 11.8      A/P: Type II or unspecified type diabetes mellitus without mention of complication, uncontrolled - Healthy Lifestyle challenging w/ current work hours. Pt anticipates improvement; encouraged focus on better nutrition and sleep hygiene. Plan: Comprehensive metabolic panel, POCT glycosylated hemoglobin (Hb A1C)  Mixed hyperlipidemia - Plan: fenofibrate (TRICOR) 145 MG tablet, Lipid panel  Other malaise and fatigue - Advised sleep hygiene improvement.  Plan: TSH  Nocturnal leg cramps  Unspecified vitamin D deficiency - Plan: Vitamin D, 25-hydroxy

## 2012-11-21 LAB — VITAMIN D 25 HYDROXY (VIT D DEFICIENCY, FRACTURES): Vit D, 25-Hydroxy: 58 ng/mL (ref 30–89)

## 2012-11-23 ENCOUNTER — Other Ambulatory Visit: Payer: Self-pay | Admitting: Family Medicine

## 2012-11-23 DIAGNOSIS — E875 Hyperkalemia: Secondary | ICD-10-CM

## 2012-11-23 NOTE — Progress Notes (Signed)
Quick Note:  Please contact pt and advise that the following labs are abnormal... Blood sugar is high (we expected that given your high A1c=11.8%). Potassium is a little above normal and kidney function test suggest Diabetes is affecting the kidneys and/or your blood pressure medication may be causing a problem. Getting your Diabetes under better control is extremely important. You will need to come back in before September to recheck these labs; I want to recheck the kidney function test and potassium in 3-4 weeks.  Lipids- better than 5 months ago but still high due to poor Diabetes control. Good news- Triglycerides are 1/2 of what they were 5 months ago! Continue current medications; Lipids will be checked again in September.  Vitamin D and thyroid tests are normal.  Copy to pt. ______

## 2012-12-25 ENCOUNTER — Encounter: Payer: Self-pay | Admitting: Family Medicine

## 2012-12-25 ENCOUNTER — Ambulatory Visit (INDEPENDENT_AMBULATORY_CARE_PROVIDER_SITE_OTHER): Payer: 59 | Admitting: Family Medicine

## 2012-12-25 VITALS — BP 119/66 | HR 71 | Temp 98.5°F | Resp 16 | Ht 66.5 in | Wt 217.0 lb

## 2012-12-25 DIAGNOSIS — E875 Hyperkalemia: Secondary | ICD-10-CM

## 2012-12-25 LAB — BASIC METABOLIC PANEL
CO2: 26 mEq/L (ref 19–32)
Chloride: 100 mEq/L (ref 96–112)
Creat: 0.95 mg/dL (ref 0.50–1.10)
Glucose, Bld: 177 mg/dL — ABNORMAL HIGH (ref 70–99)

## 2012-12-25 NOTE — Patient Instructions (Addendum)
MEDICATION change-  Lisinopril- HCTZ 20- 12.5 MG   Get a pill cutter and cut this pill in half. Take 1/2 tablet every morning. Try to measure your blood pressure at home so I have some idea of the readings when you are going about your daily routine.  I will see you in September.   Have a great summer!

## 2012-12-26 ENCOUNTER — Encounter: Payer: Self-pay | Admitting: Family Medicine

## 2012-12-26 NOTE — Progress Notes (Signed)
S:  This 62 y.o. Cauc female is here to have labs repeated; previous CMET drawn in May had elevated CR and K+. Pt has changed her nutrition since last visit and has less job-related stress. She is using a dietary supplement (VEMMA) that contains botanicals, minerals and vitamins. She feels better and has lost 5-6 pounds since starting this product 2-3 weeks ago.  Patient Active Problem List   Diagnosis Date Noted  . Obesity, Class II, BMI 35-39.9, with comorbidity 07/02/2012  . Cardiovascular risk factor 07/02/2012  . AR (allergic rhinitis) 02/21/2012  . Microhematuria   . High cholesterol 08/29/2011  . Diabetes mellitus 08/29/2011    PMHx, Soc Hx and Fam Hx reviewed.  O:  Filed Vitals:   12/25/12 0947  BP: 119/66                     Weight down 6 lbs since Jan 2014  Pulse: 71  Temp: 98.5 F (36.9 C)  Resp: 16   GEN: in NAD; WN,WD.  Appears slightly fatigued (just came from work). COR: RRR. LUNGS: Normal resp rate and effort. SKIN: W&D. No erythema or pallor. MS: MAEs; no c/c/e. No deformities. NEURO: A&O x 3; CNs intact. Nonfocal.  A/P: Hyperkalemia - May 2014-  K+= 5.8 and Cr= 1.3  Plan: Basic metabolic panel Reduce BP medication (Lisinopril- HCTZ 20-12.5 mg ) to 1/2 tablet daily; monitor BP at home.

## 2013-03-26 ENCOUNTER — Ambulatory Visit: Payer: 59 | Admitting: Family Medicine

## 2013-05-06 ENCOUNTER — Other Ambulatory Visit: Payer: Self-pay

## 2013-05-26 ENCOUNTER — Ambulatory Visit: Payer: 59 | Admitting: Family Medicine

## 2013-06-10 ENCOUNTER — Encounter: Payer: Self-pay | Admitting: *Deleted

## 2013-08-20 ENCOUNTER — Ambulatory Visit: Payer: 59 | Admitting: Family Medicine

## 2013-10-01 ENCOUNTER — Encounter: Payer: Self-pay | Admitting: Family Medicine

## 2013-10-01 ENCOUNTER — Ambulatory Visit (INDEPENDENT_AMBULATORY_CARE_PROVIDER_SITE_OTHER): Payer: 59 | Admitting: Family Medicine

## 2013-10-01 VITALS — BP 130/74 | HR 75 | Temp 98.5°F | Resp 16 | Ht 65.5 in | Wt 210.0 lb

## 2013-10-01 DIAGNOSIS — E1165 Type 2 diabetes mellitus with hyperglycemia: Secondary | ICD-10-CM

## 2013-10-01 DIAGNOSIS — IMO0001 Reserved for inherently not codable concepts without codable children: Secondary | ICD-10-CM

## 2013-10-01 DIAGNOSIS — E119 Type 2 diabetes mellitus without complications: Secondary | ICD-10-CM

## 2013-10-01 DIAGNOSIS — T887XXA Unspecified adverse effect of drug or medicament, initial encounter: Secondary | ICD-10-CM

## 2013-10-01 DIAGNOSIS — E782 Mixed hyperlipidemia: Secondary | ICD-10-CM

## 2013-10-01 LAB — GLUCOSE, POCT (MANUAL RESULT ENTRY): POC Glucose: 150 mg/dl — AB (ref 70–99)

## 2013-10-01 LAB — COMPLETE METABOLIC PANEL WITH GFR
ALBUMIN: 4.4 g/dL (ref 3.5–5.2)
ALK PHOS: 48 U/L (ref 39–117)
ALT: 16 U/L (ref 0–35)
AST: 26 U/L (ref 0–37)
BILIRUBIN TOTAL: 0.6 mg/dL (ref 0.2–1.2)
BUN: 18 mg/dL (ref 6–23)
CO2: 25 mEq/L (ref 19–32)
CREATININE: 0.88 mg/dL (ref 0.50–1.10)
Calcium: 9.9 mg/dL (ref 8.4–10.5)
Chloride: 101 mEq/L (ref 96–112)
GFR, EST NON AFRICAN AMERICAN: 71 mL/min
GFR, Est African American: 81 mL/min
GLUCOSE: 149 mg/dL — AB (ref 70–99)
POTASSIUM: 4.9 meq/L (ref 3.5–5.3)
Sodium: 135 mEq/L (ref 135–145)
Total Protein: 6.7 g/dL (ref 6.0–8.3)

## 2013-10-01 LAB — POCT GLYCOSYLATED HEMOGLOBIN (HGB A1C): HEMOGLOBIN A1C: 8

## 2013-10-01 MED ORDER — ROSUVASTATIN CALCIUM 20 MG PO TABS: ORAL_TABLET | ORAL | Status: DC

## 2013-10-01 MED ORDER — ROSUVASTATIN CALCIUM 20 MG PO TABS: 20.0000 mg | ORAL_TABLET | Freq: Every day | ORAL | Status: DC

## 2013-10-01 MED ORDER — LISINOPRIL-HYDROCHLOROTHIAZIDE 20-12.5 MG PO TABS: 1.0000 | ORAL_TABLET | Freq: Every day | ORAL | Status: DC

## 2013-10-01 MED ORDER — SITAGLIPTIN PHOS-METFORMIN HCL 50-1000 MG PO TABS: ORAL_TABLET | ORAL | Status: DC

## 2013-10-01 NOTE — Progress Notes (Signed)
S:  This 63 y.o. Cauc female has Type II DM, HTN, hyperlipidemia w/ elevated CHD risk. She has been evaluated by Dr. Jacinto HalimGanji and Crestor 20 mg 1 tab daily was prescribed. Pt developed myalgias and had to discontinue the med. Muscle aches disappeared off statin. She had same problem with Lipitor years ago. She is willing to try lower dose of Crestor. She has followed nutrition guidelines provided by Dr. Jacinto HalimGanji. Blood sugars have been 110-130; no hypoglycemia. Previous A1c = 11.8% (May 2014).  Work related stressors and lost of dear friend/co-worker (hit by drunk driver) have affected pt's mental and physical well being. Extended work hours (14+) have taken a toll on her. She hopes to resume walking and better sleep since work issues are being resolved.  Patient Active Problem List   Diagnosis Date Noted  . Obesity, Class II, BMI 35-39.9, with comorbidity 07/02/2012  . Cardiovascular risk factor 07/02/2012  . AR (allergic rhinitis) 02/21/2012  . Microhematuria   . Elevated triglycerides with high cholesterol 08/29/2011  . Diabetes mellitus 08/29/2011   Prior to Admission medications   Medication Sig Start Date End Date Taking? Authorizing Provider  aspirin 81 MG tablet Take 160 mg by mouth 2 (two) times daily.    Yes Historical Provider, MD  fenofibrate (TRICOR) 145 MG tablet Take 1 tablet (145 mg total) by mouth daily. 11/20/12  Yes Maurice MarchBarbara B Namon Villarin, MD  lisinopril-hydrochlorothiazide (ZESTORETIC) 20-12.5 MG per tablet Take 1 tablet by mouth daily.   Yes Maurice MarchBarbara B Lexington Krotz, MD  OVER THE COUNTER MEDICATION Aleve taking prn   Yes Historical Provider, MD  sitaGLIPtan-metformin (JANUMET) 50-1000 MG per tablet Take 1 tablet twice a day with meals. 11/20/12  Yes Maurice MarchBarbara B Tieasha Larsen, MD  Calcium Carbonate-Vitamin D (CALCIUM 600 + D PO) Take 1,200 mg by mouth daily.    Historical Provider, MD  OVER THE COUNTER MEDICATION Krill Oil 500 mg taking    Historical Provider, MD  rosuvastatin (CRESTOR) 20 MG  tablet Take 1 tablet (20 mg total) by mouth daily. Take 1/2- 1 tablet as directed at bedtime.    Maurice MarchBarbara B Zaniel Marineau, MD  terconazole (TERAZOL 7) 0.4 % vaginal cream Place 1 applicator vaginally at bedtime. 07/26/12   Morrell RiddleSarah L Weber, PA-C   PMHx, Surg Hx, Soc and Fam Hx reviewed.  ROS: As per HPI; negative for diaphoresis, abnormal weight change, vision disturbances, CP or tightness, palpitations, edema, SOB or DOE, cough, HA, dizziness, lightheadedness, numbness, weakness or syncope.  She has had mild depressive symptoms but no agitation, confusion, behavior changes or thoughts of self harm.  O: Filed Vitals:   10/01/13 0929  BP: 130/74  Pulse: 75  Temp: 98.5 F (36.9 C)  Resp: 16   GEN: In NAD; WN,WD. HENT: [place]/AT; EOMI w/ clear conj/sclerae. Otherwise unremarkable. NECK: Supple w/o LAN or TMG.  COR: RRR. No edema. LUNGS: Normal resp rate and effort. SKIN: W&D; intact w/o diaphoresis, erythema or jaundice. MS: MAEs; no muscle tenderness or atrophy. No c/c/e. NEURO: A&O x 3; CNs intact. Nonfocal.   Results for orders placed in visit on 10/01/13  POCT GLYCOSYLATED HEMOGLOBIN (HGB A1C)      Result Value Ref Range   Hemoglobin A1C 8.0    GLUCOSE, POCT (MANUAL RESULT ENTRY)      Result Value Ref Range   POC Glucose 150 (*) 70 - 99 mg/dl    A/P: Elevated triglycerides with high cholesterol - Pt willing to try low dose statin (Crestor recommended by Cardiologist). Start w/  Crestor 20 mg  1/4 tablet hs for 2-3 weeks. If weel tolerated, increase dose to 1/2 tablet= 10 mg hs. Plan: POCT glycosylated hemoglobin (Hb A1C), POCT glucose (manual entry), COMPLETE METABOLIC PANEL WITH GFR, Lipid panel  Type II or unspecified type diabetes mellitus without mention of complication, not stated as uncontrolled- Improved control. Continue to focus on TLCs (nutrition and increased physical activity).  Medication side effect- Myalgias due to statins.  Meds ordered this encounter  Medications  .  DISCONTD: rosuvastatin (CRESTOR) 20 MG tablet    Sig: Take 1 tablet (20 mg total) by mouth daily. Take 1/2- 1 tablet as directed at bedtime.    Dispense:  90 tablet    Refill:  1  . lisinopril-hydrochlorothiazide (ZESTORETIC) 20-12.5 MG per tablet    Sig: Take 1 tablet by mouth daily.    Dispense:  90 tablet    Refill:  1  . sitaGLIPtin-metformin (JANUMET) 50-1000 MG per tablet    Sig: Take 1 tablet twice a day with meals.    Dispense:  180 tablet    Refill:  3  . rosuvastatin (CRESTOR) 20 MG tablet    Sig: Take 1/2- 1 tablet as directed at bedtime.    Dispense:  90 tablet    Refill:  1

## 2013-10-01 NOTE — Patient Instructions (Signed)
Continue all current medications as we discussed. Get back into a good walking routine and follow the heart healthy guidelines given to you by Dr. Jacinto HalimGanji. I would like to recheck your lipids in about 6-8 weeks once you have been on the lower dose of Crestor for > 4 weeks. Let me know if you do not tolerate the lower dose (take 1/4 tablet for 2 weeks then increase to 1/2 tablet if you have no problems).

## 2013-10-02 ENCOUNTER — Encounter: Payer: Self-pay | Admitting: Family Medicine

## 2013-10-04 ENCOUNTER — Encounter: Payer: Self-pay | Admitting: Family Medicine

## 2013-12-31 ENCOUNTER — Other Ambulatory Visit: Payer: Self-pay | Admitting: Family Medicine

## 2014-01-02 ENCOUNTER — Telehealth: Payer: Self-pay | Admitting: Physician Assistant

## 2014-01-02 ENCOUNTER — Ambulatory Visit (INDEPENDENT_AMBULATORY_CARE_PROVIDER_SITE_OTHER): Payer: 59 | Admitting: Physician Assistant

## 2014-01-02 VITALS — BP 114/62 | HR 98 | Temp 98.6°F | Resp 18 | Ht 66.0 in | Wt 203.0 lb

## 2014-01-02 DIAGNOSIS — E119 Type 2 diabetes mellitus without complications: Secondary | ICD-10-CM

## 2014-01-02 DIAGNOSIS — M25579 Pain in unspecified ankle and joints of unspecified foot: Secondary | ICD-10-CM | POA: Insufficient documentation

## 2014-01-02 DIAGNOSIS — M25519 Pain in unspecified shoulder: Secondary | ICD-10-CM

## 2014-01-02 DIAGNOSIS — E782 Mixed hyperlipidemia: Secondary | ICD-10-CM

## 2014-01-02 DIAGNOSIS — K644 Residual hemorrhoidal skin tags: Secondary | ICD-10-CM

## 2014-01-02 DIAGNOSIS — M25511 Pain in right shoulder: Secondary | ICD-10-CM

## 2014-01-02 DIAGNOSIS — IMO0001 Reserved for inherently not codable concepts without codable children: Secondary | ICD-10-CM

## 2014-01-02 DIAGNOSIS — E1165 Type 2 diabetes mellitus with hyperglycemia: Principal | ICD-10-CM

## 2014-01-02 DIAGNOSIS — Z1159 Encounter for screening for other viral diseases: Secondary | ICD-10-CM

## 2014-01-02 DIAGNOSIS — Z23 Encounter for immunization: Secondary | ICD-10-CM

## 2014-01-02 DIAGNOSIS — M25571 Pain in right ankle and joints of right foot: Secondary | ICD-10-CM

## 2014-01-02 LAB — COMPREHENSIVE METABOLIC PANEL
ALK PHOS: 54 U/L (ref 39–117)
ALT: 13 U/L (ref 0–35)
AST: 17 U/L (ref 0–37)
Albumin: 4.3 g/dL (ref 3.5–5.2)
BILIRUBIN TOTAL: 0.3 mg/dL (ref 0.2–1.2)
BUN: 17 mg/dL (ref 6–23)
CO2: 23 mEq/L (ref 19–32)
CREATININE: 1.13 mg/dL — AB (ref 0.50–1.10)
Calcium: 10 mg/dL (ref 8.4–10.5)
Chloride: 98 mEq/L (ref 96–112)
GLUCOSE: 310 mg/dL — AB (ref 70–99)
Potassium: 5.3 mEq/L (ref 3.5–5.3)
Sodium: 133 mEq/L — ABNORMAL LOW (ref 135–145)
Total Protein: 6.8 g/dL (ref 6.0–8.3)

## 2014-01-02 LAB — LIPID PANEL
CHOL/HDL RATIO: 6.6 ratio
Cholesterol: 218 mg/dL — ABNORMAL HIGH (ref 0–200)
HDL: 33 mg/dL — AB (ref >39)
LDL CALC: 138 mg/dL — AB (ref 0–99)
TRIGLYCERIDES: 237 mg/dL — AB (ref <150)
VLDL: 47 mg/dL — AB (ref 0–40)

## 2014-01-02 LAB — POCT GLYCOSYLATED HEMOGLOBIN (HGB A1C): Hemoglobin A1C: 10.1

## 2014-01-02 LAB — GLUCOSE, POCT (MANUAL RESULT ENTRY): POC GLUCOSE: 306 mg/dL — AB (ref 70–99)

## 2014-01-02 LAB — HEPATITIS C ANTIBODY: HCV AB: NEGATIVE

## 2014-01-02 MED ORDER — ZOSTER VACCINE LIVE 19400 UNT/0.65ML ~~LOC~~ SOLR: 0.6500 mL | Freq: Once | SUBCUTANEOUS | Status: DC

## 2014-01-02 MED ORDER — FENOFIBRATE 145 MG PO TABS: ORAL_TABLET | ORAL | Status: DC

## 2014-01-02 MED ORDER — LIDOCAINE 5 % EX OINT: 1.0000 "application " | TOPICAL_OINTMENT | CUTANEOUS | Status: DC | PRN

## 2014-01-02 MED ORDER — MELOXICAM 15 MG PO TABS: 15.0000 mg | ORAL_TABLET | Freq: Every day | ORAL | Status: DC

## 2014-01-02 NOTE — Patient Instructions (Signed)
I will contact you with your lab results as soon as they are available.   If you have not heard from me in 2 weeks, please contact me.  The fastest way to get your results is to register for My Chart (see the instructions on the last page of this printout).  I'll pull your paper chart to see when your last colonoscopy was.  Take the prescription for the Shingles Vaccine to your pharmacy.  They will administer the injection there.

## 2014-01-02 NOTE — Telephone Encounter (Signed)
This patient had a colonoscopy at [facility], but doesn't remember when. There is no report in her paper record.  Please contact [facility] and get the date of her colonoscopy and when they recommend follow up.

## 2014-01-02 NOTE — Progress Notes (Signed)
Subjective:    Patient ID: Marissa Leach, female    DOB: 07/29/1950, 63 y.o.   MRN: [id number]   PCP: No primary provider on file.  Chief Complaint  Patient presents with  . Medication Refill    Fenofibrate  . Shoulder Pain    2-3 months, right  . Ankle Pain    right  . Leg Pain    both, cramps  . Hemorrhoids    Medications, allergies, past medical history, surgical history, family history, social history and problem list reviewed and updated.  Patient Active Problem List   Diagnosis Date Noted  . Pain in joint, shoulder region 01/02/2014  . Pain in joint, ankle and foot 01/02/2014  . Obesity, Class II, BMI 35-39.9, with comorbidity 07/02/2012  . Cardiovascular risk factor 07/02/2012  . AR (allergic rhinitis) 02/21/2012  . Microhematuria   . Elevated triglycerides with high cholesterol 08/29/2011  . Diabetes mellitus type 2, controlled 08/29/2011    Prior to Admission medications   Medication Sig Start Date End Date Taking? Authorizing Provider  aspirin 81 MG tablet Take 160 mg by mouth 2 (two) times daily.    Yes Historical Provider, MD  fenofibrate (TRICOR) 145 MG tablet TAKE 1 TABLET (145 MG TOTAL) BY MOUTH DAILY. 12/31/13  Yes Eleanore E Debbra RidingEgan, PA-C  ibuprofen (ADVIL,MOTRIN) 200 MG tablet Take 200 mg by mouth daily as needed for moderate pain.   Yes Historical Provider, MD  lisinopril-hydrochlorothiazide (ZESTORETIC) 20-12.5 MG per tablet Take 1 tablet by mouth daily. 10/01/13  Yes Maurice MarchBarbara B McPherson, MD  sitaGLIPtin-metformin (JANUMET) 50-1000 MG per tablet Take 1 tablet twice a day with meals. 10/01/13  Yes Maurice MarchBarbara B McPherson, MD    HPI  Feels a hard, cone-shaped something that has come up next to a hemorrhoid that she's had for 30+ years.  Sometimes causes pain and bleeding with BM. No melena. Has had a colonoscopy at [facility], but isn't sure when.  She thinks she was told to repeat it in 10 years.  RIGHT shoulder bothering me for several months.  My arm hurts  down to my elbow.  Can't lie on that side.  Sometimes it really aches and keeps me from sleeping.  Sometimes it's just kind of there.  RIGHT ankle started hurting when she started walking so much.  Works on her feet, 8-16 hours/day, in steel toed shoes.  Periodically gets cramps in her big toes at night.  Stopped taking the Crestor recommended by cardiologist due to severe muscle aches.  Has been taking fenofibrate since then.  Has lost almost 100 lbs, and has had improved DM control. Has had some additional weight loss over the past month (10-12 pounds in the past 6 weeks), without really trying.  Notes reduced appetite, too. She's had periods of time with rapid weight loss in the past.  Review of Systems As above. No CP, SOB, dizziness, N/V.    Objective:   Physical Exam  Vitals reviewed. Constitutional: She is oriented to person, place, and time. She appears well-developed and well-nourished. No distress.  BP 114/62  Pulse 98  Temp(Src) 98.6 F (37 C)  Resp 18  Ht 5\' 6"  (1.676 m)  Wt 203 lb (92.08 kg)  BMI 32.78 kg/m2  SpO2 97%   Eyes: Conjunctivae are normal. No scleral icterus.  Neck: No thyromegaly present.  Cardiovascular: Normal rate, regular rhythm, normal heart sounds and intact distal pulses.   Pulmonary/Chest: Effort normal and breath sounds normal.  Genitourinary: Rectal exam  shows external hemorrhoid (smaller, at 6 o'clock, with small ulceration and bleeding) and tenderness.     Musculoskeletal:       Right shoulder: She exhibits tenderness. She exhibits normal range of motion and no swelling.       Right elbow: Normal.      Right wrist: Normal.       Right knee: Normal.       Right ankle: She exhibits normal range of motion and no swelling. Tenderness. Achilles tendon normal.       Left ankle: Normal. Achilles tendon normal.       Arms:      Feet:  Lymphadenopathy:    She has no cervical adenopathy.  Neurological: She is alert and oriented to person, place,  and time.  Skin: Skin is warm, dry and intact.  See diabetic foot exam.   Psychiatric: She has a normal mood and affect. Her behavior is normal.          Assessment & Plan:  1. Elevated triglycerides with high cholesterol Await lab results. - Lipid panel - fenofibrate (TRICOR) 145 MG tablet; TAKE 1 TABLET (145 MG TOTAL) BY MOUTH DAILY.  Dispense: 90 tablet; Refill: 0  2. Pain in joint, shoulder region, right 3. Pain in joint, ankle and foot, right Trial of daily NSAIDS x 2 weeks. If symptoms persist, plan xrays. - meloxicam (MOBIC) 15 MG tablet; Take 1 tablet (15 mg total) by mouth daily.  Dispense: 30 tablet; Refill: 0  4. Need for hepatitis C screening test - Hepatitis C antibody  5. Diabetes mellitus type 2, controlled Await lab results. - POCT glucose (manual entry) - POCT glycosylated hemoglobin (Hb A1C) - Comprehensive metabolic panel - Microalbumin, urine  6. Residual hemorrhoidal skin tags If symptoms persist, plan referral to [facility], though consider topical diltiazem if ulcer persists. I'll pull her paper record and see when her last colonoscopy was, and when the recommended follow-up was advised. - lidocaine (XYLOCAINE) 5 % ointment; Apply 1 application topically as needed.  Dispense: 35.44 g; Refill: 0  7. Need for shingles vaccine Patient will take prescription to the pharmacy for this injection. - zoster vaccine live, PF, (ZOSTAVAX) [id number] UNT/0.65ML injection; Inject 19,400 Units into the skin once.  Dispense: 0.65 mL; Refill: 0   Fernande Brashelle S. Kaylah Chiasson, PA-C Physician Assistant-Certified [facility]

## 2014-01-03 LAB — HM DIABETES EYE EXAM

## 2014-01-03 LAB — MICROALBUMIN, URINE: Microalb, Ur: 1.54 mg/dL (ref 0.00–1.89)

## 2014-01-03 MED ORDER — GLIPIZIDE 5 MG PO TABS: 5.0000 mg | ORAL_TABLET | Freq: Two times a day (BID) | ORAL | Status: DC

## 2014-01-03 NOTE — Telephone Encounter (Signed)
Spoke to [place] at [facility]- pt has not had any appointment with this facility.  LM for pt to rtn call.

## 2014-01-03 NOTE — Telephone Encounter (Signed)
Spoke with pt and let her know the below info and advised her to call them and see if she can figure out when her last appt was.

## 2014-01-03 NOTE — Addendum Note (Signed)
Addended by: Porfirio OarJEFFERY, Gay Moncivais S on: 01/03/2014 11:40 AM   Modules accepted: Orders

## 2014-02-04 ENCOUNTER — Encounter: Payer: 59 | Admitting: Family Medicine

## 2014-06-20 ENCOUNTER — Encounter: Payer: Self-pay | Admitting: Family Medicine

## 2014-06-25 ENCOUNTER — Other Ambulatory Visit: Payer: Self-pay | Admitting: Physician Assistant

## 2014-07-25 ENCOUNTER — Ambulatory Visit (INDEPENDENT_AMBULATORY_CARE_PROVIDER_SITE_OTHER): Payer: 59

## 2014-07-25 ENCOUNTER — Encounter: Payer: Self-pay | Admitting: Physician Assistant

## 2014-07-25 ENCOUNTER — Ambulatory Visit (INDEPENDENT_AMBULATORY_CARE_PROVIDER_SITE_OTHER): Payer: 59 | Admitting: Internal Medicine

## 2014-07-25 VITALS — BP 140/80 | HR 104 | Temp 98.6°F | Resp 18 | Ht 66.0 in | Wt 218.0 lb

## 2014-07-25 DIAGNOSIS — M25531 Pain in right wrist: Secondary | ICD-10-CM

## 2014-07-25 DIAGNOSIS — Z23 Encounter for immunization: Secondary | ICD-10-CM

## 2014-07-25 DIAGNOSIS — E1165 Type 2 diabetes mellitus with hyperglycemia: Secondary | ICD-10-CM

## 2014-07-25 DIAGNOSIS — M19031 Primary osteoarthritis, right wrist: Secondary | ICD-10-CM | POA: Insufficient documentation

## 2014-07-25 DIAGNOSIS — IMO0001 Reserved for inherently not codable concepts without codable children: Secondary | ICD-10-CM

## 2014-07-25 DIAGNOSIS — E782 Mixed hyperlipidemia: Secondary | ICD-10-CM

## 2014-07-25 LAB — COMPREHENSIVE METABOLIC PANEL
ALT: 26 U/L (ref 0–35)
AST: 21 U/L (ref 0–37)
Albumin: 4.3 g/dL (ref 3.5–5.2)
Alkaline Phosphatase: 46 U/L (ref 39–117)
BUN: 22 mg/dL (ref 6–23)
CALCIUM: 10.1 mg/dL (ref 8.4–10.5)
CO2: 24 meq/L (ref 19–32)
Chloride: 96 mEq/L (ref 96–112)
Creat: 1.11 mg/dL — ABNORMAL HIGH (ref 0.50–1.10)
Glucose, Bld: 239 mg/dL — ABNORMAL HIGH (ref 70–99)
Potassium: 5.1 mEq/L (ref 3.5–5.3)
Sodium: 135 mEq/L (ref 135–145)
Total Bilirubin: 0.4 mg/dL (ref 0.2–1.2)
Total Protein: 6.8 g/dL (ref 6.0–8.3)

## 2014-07-25 LAB — LIPID PANEL
CHOL/HDL RATIO: 7.2 ratio
CHOLESTEROL: 258 mg/dL — AB (ref 0–200)
HDL: 36 mg/dL — AB (ref >39)
LDL Cholesterol: 157 mg/dL — ABNORMAL HIGH (ref 0–99)
TRIGLYCERIDES: 325 mg/dL — AB (ref <150)
VLDL: 65 mg/dL — ABNORMAL HIGH (ref 0–40)

## 2014-07-25 LAB — POCT GLYCOSYLATED HEMOGLOBIN (HGB A1C): HEMOGLOBIN A1C: 8.8

## 2014-07-25 LAB — GLUCOSE, POCT (MANUAL RESULT ENTRY): POC Glucose: 232 mg/dl — AB (ref 70–99)

## 2014-07-25 MED ORDER — MELOXICAM 15 MG PO TABS: 15.0000 mg | ORAL_TABLET | Freq: Every day | ORAL | Status: DC

## 2014-07-25 MED ORDER — GLIPIZIDE 10 MG PO TABS: 10.0000 mg | ORAL_TABLET | Freq: Two times a day (BID) | ORAL | Status: DC

## 2014-07-25 MED ORDER — LISINOPRIL-HYDROCHLOROTHIAZIDE 20-12.5 MG PO TABS: 1.0000 | ORAL_TABLET | Freq: Every day | ORAL | Status: DC

## 2014-07-25 MED ORDER — FENOFIBRATE 145 MG PO TABS: 145.0000 mg | ORAL_TABLET | Freq: Every day | ORAL | Status: DC

## 2014-07-25 NOTE — Progress Notes (Signed)
Subjective:    Patient ID: Marissa Leach, female    DOB: 08/21/1950, 64 y.o.   MRN: [id number]   PCP: Dow AdolphMCPHERSON,BARBARA, MD  Chief Complaint  Patient presents with  . Arm Pain    Right arm wrist and shoulder pain x a few months worse yesterday  . Labs Only    Cholesterol    Allergies  Allergen Reactions  . Crestor [Rosuvastatin] Other (See Comments)    Severe muscle aches  . Lipitor [Atorvastatin]     Severe muscle and joint aches    Patient Active Problem List   Diagnosis Date Noted  . Pain in joint, shoulder region 01/02/2014  . Pain in joint, ankle and foot 01/02/2014  . Obesity, Class II, BMI 35-39.9, with comorbidity 07/02/2012  . Cardiovascular risk factor 07/02/2012  . AR (allergic rhinitis) 02/21/2012  . Microhematuria   . Elevated triglycerides with high cholesterol 08/29/2011  . Diabetes mellitus type 2, uncontrolled, without complications 08/29/2011    Prior to Admission medications   Medication Sig Start Date End Date Taking? Authorizing Provider  aspirin 81 MG tablet Take 160 mg by mouth 2 (two) times daily.    Yes Historical Provider, MD  fenofibrate (TRICOR) 145 MG tablet Take 1 tablet (145 mg total) by mouth daily. PATIENT NEEDS OFFICE VISIT FOR ADDITIONAL REFILLS 06/27/14  Yes Maurice MarchBarbara B McPherson, MD  glipiZIDE (GLUCOTROL) 5 MG tablet Take 1 tablet (5 mg total) by mouth 2 (two) times daily before a meal. 01/03/14  Yes Blaiden Werth S Latania Bascomb, PA-C  lisinopril-hydrochlorothiazide (ZESTORETIC) 20-12.5 MG per tablet Take 1 tablet by mouth daily. 10/01/13  Yes Maurice MarchBarbara B McPherson, MD  sitaGLIPtin-metformin (JANUMET) 50-1000 MG per tablet Take 1 tablet twice a day with meals. 10/01/13  Yes Maurice MarchBarbara B McPherson, MD  meloxicam (MOBIC) 15 MG tablet Take 1 tablet (15 mg total) by mouth daily. Patient not taking: Reported on 07/25/2014 01/02/14   Fernande Brashelle S Janese Radabaugh, PA-C    Medical, Surgical, Family and Social History reviewed and updated.  HPI  Presents for evaluation of  RIGHT wrist pain. Needs a refill of Tricor. She hasn't been back since her visit in July. Isn't doing well with healthy eating or exercise. Has submitted her intent to retire 09/30/2014, and plans to focus on her health after that.  Frequency of home glucose monitoring: doesn't check. Sees a dentist Q6 months (last week), eye specialist annually (next 01/04/2015). Checks feet daily. Is current with influenza vaccine. Is current with pneumococcal vaccine (will need Prevnar-13 and booster of Pneumovax-23 at age 64).  The wrist pain began several days ago while cooking. No specific trauma or injury. Pain with rotation of the wrist and flexion. She has had considerable improvement in the shoulder pain, now flares only intermittently, but has run out of the meloxicam prescribed in July. It worked well when she used it. She is RIGHT hand dominant. Notes some swelling on the RIGHT hand and wrist, and reports that the hand feels "tight" when she tries to grasp.   Review of Systems Denies chest pain, shortness of breath, HA, dizziness, vision change, nausea, vomiting, diarrhea, constipation, melena, hematochezia, dysuria, increased urinary urgency or frequency, increased hunger or thirst, unintentional weight change, other unexplained myalgias or arthralgias, rash.     Objective:   Physical Exam  Constitutional: She is oriented to person, place, and time. She appears well-developed and well-nourished. No distress.  HENT:  Head: Normocephalic and atraumatic.  Eyes: Conjunctivae are normal. No scleral icterus.  Neck: Neck  supple. No thyromegaly present.  Cardiovascular: Normal rate, regular rhythm, normal heart sounds and intact distal pulses.   Pulmonary/Chest: Effort normal and breath sounds normal.  Musculoskeletal:       Right shoulder: She exhibits tenderness (mild, superiorly and anteriorly) and pain (intermittent). She exhibits no bony tenderness, no swelling, no effusion, no crepitus, no spasm  and normal pulse.       Left shoulder: Normal.       Right elbow: Normal.      Right wrist: She exhibits decreased range of motion, tenderness, bony tenderness (tenderness is along the distal radius) and swelling (mild). She exhibits no crepitus and no deformity.       Right forearm: She exhibits tenderness, bony tenderness and swelling (mild).       Arms:      Right hand: She exhibits swelling (mild). She exhibits normal range of motion, no tenderness, no bony tenderness, normal capillary refill, no deformity and no laceration. Normal sensation noted. Normal strength noted.  Lymphadenopathy:    She has no cervical adenopathy.  Neurological: She is alert and oriented to person, place, and time. She has normal strength. No sensory deficit.  Skin: Skin is warm, dry and intact. No rash noted.  Psychiatric: She has a normal mood and affect. Her speech is normal and behavior is normal.  Vitals reviewed.  RIGHT Wrist: [facility] reading (PRIMARY) by  Dr. Perrin Maltese. Severe DJD of the first MCP.  Results for orders placed or performed in visit on 07/25/14  POCT glucose (manual entry)  Result Value Ref Range   POC Glucose 232 (A) 70 - 99 mg/dl  POCT glycosylated hemoglobin (Hb A1C)  Result Value Ref Range   Hemoglobin A1C 8.8   HM DIABETES EYE EXAM  Result Value Ref Range   HM Diabetic Eye Exam No Retinopathy No Retinopathy       Assessment & Plan:  1. Wrist pain, acute, right 6. Osteoarthritis of right wrist, unspecified osteoarthritis type Thumb spica splint. Referral to ortho. Meloxicam PRN. She notes considerable improvement with placement of the splint in the office. - Ambulatory referral to Orthopedic Surgery - meloxicam (MOBIC) 15 MG tablet; Take 1 tablet (15 mg total) by mouth daily.  Dispense: 30 tablet; Refill: 0 - DG Wrist Complete Right; Future  2. Diabetes mellitus type 2, uncontrolled, without complications Uncontrolled. Increase glipizide from 5 mg BID to 10 mg BID. Increase  efforts for healthy eating and increase exercise. RTC 3 months for recheck and fasting labs. - POCT glucose (manual entry) - POCT glycosylated hemoglobin (Hb A1C) - Comprehensive metabolic panel - lisinopril-hydrochlorothiazide (ZESTORETIC) 20-12.5 MG per tablet; Take 1 tablet by mouth daily.  Dispense: 90 tablet; Refill: 1 - glipiZIDE (GLUCOTROL) 10 MG tablet; Take 1 tablet (10 mg total) by mouth 2 (two) times daily before a meal.  Dispense: 180 tablet; Refill: 3  3. Elevated triglycerides with high cholesterol Not fasting today. Expect she'll need additional agent. As she is intolerant of two statins, consider Niacin or Zetia. - Lipid panel - fenofibrate (TRICOR) 145 MG tablet; Take 1 tablet (145 mg total) by mouth daily. PATIENT NEEDS OFFICE VISIT FOR ADDITIONAL REFILLS  Dispense: 30 tablet; Refill: 0  4. Obesity, Class II, BMI 35-39.9, with comorbidity See above.  5. Need for Tdap vaccination - Tdap vaccine greater than or equal to 7yo IM     Fernande Bras, PA-C Physician Assistant-Certified [facility]

## 2014-07-25 NOTE — Patient Instructions (Addendum)
Wear the splint as much as you can.  It's for comfort, so if you need to remove it for work, that's ok. The orthopedics office will contact you directly. Use the meloxicam as needed for pain in the wrist and shoulder.  Healthy eating and regular exercise are critical. Please increase the glipizide to 10 mg twice daily (you may use up the 5 mg tablets that you have by taking 2 of them twice each day, then start the new prescription for the 10 mg tablets). When you come in next time, please make sure that you are fasting for the labs.

## 2014-07-27 ENCOUNTER — Telehealth: Payer: Self-pay | Admitting: Physician Assistant

## 2014-07-27 ENCOUNTER — Telehealth: Payer: Self-pay

## 2014-07-27 DIAGNOSIS — E782 Mixed hyperlipidemia: Secondary | ICD-10-CM

## 2014-07-27 MED ORDER — FENOFIBRATE 145 MG PO TABS: 145.0000 mg | ORAL_TABLET | Freq: Every day | ORAL | Status: DC

## 2014-07-27 NOTE — Telephone Encounter (Signed)
When this medication was refilled at the recent visit, it was for #30 with a note that she needed OV for additional refills. It should have been changed to #90, RF x 1.  Meds ordered this encounter  Medications  . fenofibrate (TRICOR) 145 MG tablet    Sig: Take 1 tablet (145 mg total) by mouth daily.    Dispense:  90 tablet    Refill:  1    Order Specific Question:  Supervising Provider    Answer:  DOOLITTLE, ROBERT P [[id number]]

## 2014-08-16 ENCOUNTER — Encounter: Payer: Self-pay | Admitting: Family Medicine

## 2014-10-22 ENCOUNTER — Encounter: Payer: Self-pay | Admitting: Urgent Care

## 2014-10-22 ENCOUNTER — Ambulatory Visit (INDEPENDENT_AMBULATORY_CARE_PROVIDER_SITE_OTHER): Payer: 59

## 2014-10-22 ENCOUNTER — Ambulatory Visit (INDEPENDENT_AMBULATORY_CARE_PROVIDER_SITE_OTHER): Payer: 59 | Admitting: Emergency Medicine

## 2014-10-22 VITALS — BP 144/72 | HR 96 | Temp 98.2°F | Ht 66.0 in | Wt 220.8 lb

## 2014-10-22 DIAGNOSIS — M7541 Impingement syndrome of right shoulder: Secondary | ICD-10-CM | POA: Diagnosis not present

## 2014-10-22 DIAGNOSIS — E782 Mixed hyperlipidemia: Secondary | ICD-10-CM | POA: Diagnosis not present

## 2014-10-22 DIAGNOSIS — M25531 Pain in right wrist: Secondary | ICD-10-CM

## 2014-10-22 DIAGNOSIS — M25511 Pain in right shoulder: Secondary | ICD-10-CM

## 2014-10-22 DIAGNOSIS — E1165 Type 2 diabetes mellitus with hyperglycemia: Secondary | ICD-10-CM

## 2014-10-22 DIAGNOSIS — M19031 Primary osteoarthritis, right wrist: Secondary | ICD-10-CM

## 2014-10-22 DIAGNOSIS — IMO0001 Reserved for inherently not codable concepts without codable children: Secondary | ICD-10-CM

## 2014-10-22 LAB — POCT GLYCOSYLATED HEMOGLOBIN (HGB A1C): Hemoglobin A1C: 11.5

## 2014-10-22 LAB — COMPLETE METABOLIC PANEL WITH GFR
ALBUMIN: 4.3 g/dL (ref 3.5–5.2)
ALT: 13 U/L (ref 0–35)
AST: 17 U/L (ref 0–37)
Alkaline Phosphatase: 48 U/L (ref 39–117)
BUN: 22 mg/dL (ref 6–23)
CO2: 25 mEq/L (ref 19–32)
Calcium: 10.1 mg/dL (ref 8.4–10.5)
Chloride: 98 mEq/L (ref 96–112)
Creat: 1.08 mg/dL (ref 0.50–1.10)
GFR, EST AFRICAN AMERICAN: 63 mL/min
GFR, EST NON AFRICAN AMERICAN: 55 mL/min — AB
GLUCOSE: 309 mg/dL — AB (ref 70–99)
Potassium: 5.9 mEq/L — ABNORMAL HIGH (ref 3.5–5.3)
SODIUM: 133 meq/L — AB (ref 135–145)
Total Bilirubin: 0.5 mg/dL (ref 0.2–1.2)
Total Protein: 7 g/dL (ref 6.0–8.3)

## 2014-10-22 LAB — LIPID PANEL
CHOLESTEROL: 265 mg/dL — AB (ref 0–200)
HDL: 28 mg/dL — AB (ref >=46)
Total CHOL/HDL Ratio: 9.5 Ratio
Triglycerides: 411 mg/dL — ABNORMAL HIGH (ref <150)

## 2014-10-22 LAB — GLUCOSE, POCT (MANUAL RESULT ENTRY): POC GLUCOSE: 296 mg/dL — AB (ref 70–99)

## 2014-10-22 MED ORDER — BLOOD GLUCOSE MONITOR KIT: PACK | Status: DC

## 2014-10-22 NOTE — Progress Notes (Signed)
MRN: [id number] DOB: 03/16/1951  Subjective:   Clearnce SorrelDonna A Kuenzi is a 64 y.o. female presenting for chief complaint of Follow-up; Labs; and Shoulder Pain  DM - managed glipizide, sitagliptin-metformin. Does not check blood sugar at home, has been changing diet and starting to exercise. Denies chest pain, shob, n/v, abdominal pain, weakness, numbness and tingling, foot infections/ulcers/wounds, blurred vision, polyphagia, polydipsia, polyuria. Denies smoking or alcohol use.  HL - taking fenofibrate and krill oil. Diet and exercise as above. ROS as above.  Shoulder - reports several month history of right shoulder pain, pain is achy in nature with sharp intermittent pains, sometimes radiates down her arm, worse with repetitive movements, lifting and moving arm above 90 degrees. She also has decreased ROM. Patient is right handed. 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 She has tried Tylenol with minimal relief of her pain and has also used meloxicam intermittently. Denies fevers, swelling, erythema, history of neck injury, shoulder injury, neck or shoulder surgery. Of note, patient is also having wrist pain, previously evaluated 07/2014 and sent to orthopedics where she was diagnosed with osteoarthritis and DeQuervain's tenosynovitis. She continues to have daily wrist pain, limited ROM. Wears wrist brace daily, wrist splint at night. Admits that both her shoulder pain and wrist pain make it extremely difficult for her to work and also wakes her up from her sleep. Patient is currently planning retirement. 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 She is worried that she will lose her retirement funds which she has earned over a period of 29 years. States that there are current legal battles and negotiations regarding mass lay offs.  Denies any other aggravating or relieving factors, no  other questions or concerns.  Lupita LeashDonna has a current medication list which includes the following prescription(s): aspirin, co q-10, fenofibrate, glipizide, krill oil, lisinopril-hydrochlorothiazide, sitagliptin-metformin, and meloxicam. She is allergic to crestor and lipitor.  Lupita LeashDonna  has a past medical history of Hypertension; Diabetes mellitus; Hyperlipidemia; and Microhematuria. Also  has past surgical history that includes Cholecystectomy and Tubal ligation.  ROS As in subjective.  Objective:   Vitals: BP 144/72 mmHg  Pulse 96  Temp(Src) 98.2 F (36.8 C) (Oral)  Ht 5\' 6"  (1.676 m)  Wt 220 lb 12.8 oz (100.154 kg)  BMI 35.65 kg/m2  SpO2 97%  Wt Readings from Last 3 Encounters:  10/22/14 220 lb 12.8 oz (100.154 kg)  07/25/14 218 lb (98.884 kg)  01/02/14 203 lb (92.08 kg)   Physical Exam  Constitutional: She is well-developed, well-nourished, and in no distress.  HENT:  Mouth/Throat: Oropharynx is clear and moist. No oropharyngeal exudate.  Neck: Neck supple.  Cardiovascular: Normal rate, regular rhythm and intact distal pulses.  Exam reveals no gallop and no friction rub.   No murmur heard. Pulmonary/Chest: No stridor. No respiratory distress. She has no wheezes. She has no rales. She exhibits no tenderness.  Abdominal: She exhibits no distension and no mass. There is no tenderness.  Musculoskeletal:       Right shoulder: She exhibits decreased range of motion (Decreased external rotation, flexion and extension, +Hawkins and Neer test) and tenderness (mild tenderness over acromion process). She exhibits no swelling, no effusion, no crepitus, no deformity, no laceration and no spasm.       Right wrist: She exhibits decreased range of motion (passive and active), tenderness (over radial aspect of wrist), bony tenderness  and swelling (trace edema). She exhibits no effusion, no crepitus, no deformity and no laceration.       Cervical back: She exhibits normal range of motion, no  tenderness (Negative Spurling maneuver), no bony tenderness, no swelling, no edema, no deformity, no laceration and no spasm.  Strength 5/5, DTR intact  Lymphadenopathy:    She has no cervical adenopathy.  Skin: Skin is warm and dry. No rash noted. No erythema. No pallor.   [facility] reading (PRIMARY) by  Dr. Cleta Alberts and PA-Editha Bridgeforth. Shoulder (Right): possible cyst at the lateral humeral head (please comment), otherwise no acute or degenerative findings. Wrist (Right): significant degenerative changes at first MCP.  Diabetic Foot Exam - Simple   Simple Foot Form  Diabetic Foot exam was performed with the following findings:  Yes 10/22/2014  9:04 AM  Visual Inspection  No deformities, no ulcerations, no other skin breakdown bilaterally:  Yes  Sensation Testing  Intact to touch and monofilament testing bilaterally:  Yes  Pulse Check  Posterior Tibialis and Dorsalis pulse intact bilaterally:  Yes  Comments     Results for orders placed or performed in visit on 10/22/14 (from the past 24 hour(s))  POCT glycosylated hemoglobin (Hb A1C)     Status: None   Collection Time: 10/22/14  9:24 AM  Result Value Ref Range   Hemoglobin A1C 11.5    Assessment and Plan :   1. Diabetes mellitus type 2, uncontrolled, without complications - Uncontrolled, patient is currently asymptomatic, counseled on risks of severely uncontrolled diabetes and need for basal insulin since she is already on 3 diabetic medications. Patient was resistant to this idea. Wants to continue with dietary modifications. I advised that she needs to start insulin and cannot count on diet and exercise alone. Patient agreed to pick up glucometer today and will follow up with me by phone tomorrow on how to proceed with medical management of her diabetes. Consider referral to endocrinologist.  2. Mixed hyperlipidemia - Uncontrolled, labs pending, has had dietary non-compliance prior to the last month. Advised continued diet and exercise  modifications. Continue Krill oil, will consider restarting lower dose of statin therapy to help with cholesterol instead of fenofibrate.  3. Osteoarthritis of right wrist, unspecified osteoarthritis type 4. Right wrist pain 5. Pain in joint, shoulder region, right 6. Shoulder impingement syndrome, right - Significant OA of the wrist, explained to patient extensively that I cannot write her out of work in the future. Patient agreed to go through the appropriate channels for management of her OA and shoulder pain. Advised that we refer her urgently to [facility]. Consider surgical intervention. Empathized with patient regarding her work situation and possible loss of retirement funds. Reiterated that I cannot compromise medical therapy to fit that need. Patient verbalized understanding.  Wallis Bamberg, PA-C [facility] [phone] 10/22/2014 8:17 AM

## 2014-10-22 NOTE — Patient Instructions (Addendum)
- 5 sets of 10 repetitions for strength exercises, 5 sets of 10 seconds for stretches - You may use ice pads alternating with heat pads for inflammation and pain relief - You may also use Tylenol or meloxicam 1/2 tablet of 15mg  per day for inflammation and pain relief  Impingement Syndrome, Rotator Cuff, Bursitis with Rehab Impingement syndrome is a condition that involves inflammation of the tendons of the rotator cuff and the subacromial bursa, that causes pain in the shoulder. The rotator cuff consists of four tendons and muscles that control much of the shoulder and upper arm function. The subacromial bursa is a fluid filled sac that helps reduce friction between the rotator cuff and one of the bones of the shoulder (acromion). Impingement syndrome is usually an overuse injury that causes swelling of the bursa (bursitis), swelling of the tendon (tendonitis), and/or a tear of the tendon (strain). Strains are classified into three categories. Grade 1 strains cause pain, but the tendon is not lengthened. Grade 2 strains include a lengthened ligament, due to the ligament being stretched or partially ruptured. With grade 2 strains there is still function, although the function may be decreased. Grade 3 strains include a complete tear of the tendon or muscle, and function is usually impaired. SYMPTOMS   Pain around the shoulder, often at the outer portion of the upper arm.  Pain that gets worse with shoulder function, especially when reaching overhead or lifting.  Sometimes, aching when not using the arm.  Pain that wakes you up at night.  Sometimes, tenderness, swelling, warmth, or redness over the affected area.  Loss of strength.  Limited motion of the shoulder, especially reaching behind the back (to the back pocket or to unhook bra) or across your body.  Crackling sound (crepitation) when moving the arm.  Biceps tendon pain and inflammation (in the front of the shoulder). Worse when  bending the elbow or lifting. CAUSES  Impingement syndrome is often an overuse injury, in which chronic (repetitive) motions cause the tendons or bursa to become inflamed. A strain occurs when a force is paced on the tendon or muscle that is greater than it can withstand. Common mechanisms of injury include: Stress from sudden increase in duration, frequency, or intensity of training.  Direct hit (trauma) to the shoulder.  Aging, erosion of the tendon with normal use.  Bony bump on shoulder (acromial spur). RISK INCREASES WITH:  Contact sports (football, wrestling, boxing).  Throwing sports (baseball, tennis, volleyball).  Weightlifting and bodybuilding.  Heavy labor.  Previous injury to the rotator cuff, including impingement.  Poor shoulder strength and flexibility.  Failure to warm up properly before activity.  Inadequate protective equipment.  Old age.  Bony bump on shoulder (acromial spur). PREVENTION   Warm up and stretch properly before activity.  Allow for adequate recovery between workouts.  Maintain physical fitness:  Strength, flexibility, and endurance.  Cardiovascular fitness.  Learn and use proper exercise technique. PROGNOSIS  If treated properly, impingement syndrome usually goes away within 6 weeks. Sometimes surgery is required.  RELATED COMPLICATIONS   Longer healing time if not properly treated, or if not given enough time to heal.  Recurring symptoms, that result in a chronic condition.  Shoulder stiffness, frozen shoulder, or loss of motion.  Rotator cuff tendon tear.  Recurring symptoms, especially if activity is resumed too soon, with overuse, with a direct blow, or when using poor technique. TREATMENT  Treatment first involves the use of ice and medicine, to reduce pain  and inflammation. The use of strengthening and stretching exercises may help reduce pain with activity. These exercises may be performed at home or with a therapist.  If non-surgical treatment is unsuccessful after more than 6 months, surgery may be advised. After surgery and rehabilitation, activity is usually possible in 3 months.  MEDICATION  If pain medicine is needed, nonsteroidal anti-inflammatory medicines (aspirin and ibuprofen), or other minor pain relievers (acetaminophen), are often advised.  Do not take pain medicine for 7 days before surgery.  Prescription pain relievers may be given, if your caregiver thinks they are needed. Use only as directed and only as much as you need.  Corticosteroid injections may be given by your caregiver. These injections should be reserved for the most serious cases, because they may only be given a certain number of times. HEAT AND COLD  Cold treatment (icing) should be applied for 10 to 15 minutes every 2 to 3 hours for inflammation and pain, and immediately after activity that aggravates your symptoms. Use ice packs or an ice massage.  Heat treatment may be used before performing stretching and strengthening activities prescribed by your caregiver, physical therapist, or athletic trainer. Use a heat pack or a warm water soak. SEEK MEDICAL CARE IF:   Symptoms get worse or do not improve in 4 to 6 weeks, despite treatment.  New, unexplained symptoms develop. (Drugs used in treatment may produce side effects.) EXERCISES  RANGE OF MOTION (ROM) AND STRETCHING EXERCISES - Impingement Syndrome (Rotator Cuff  Tendinitis, Bursitis) These exercises may help you when beginning to rehabilitate your injury. Your symptoms may go away with or without further involvement from your physician, physical therapist or athletic trainer. While completing these exercises, remember:   Restoring tissue flexibility helps normal motion to return to the joints. This allows healthier, less painful movement and activity.  An effective stretch should be held for at least 30 seconds.  A stretch should never be painful. You should only  feel a gentle lengthening or release in the stretched tissue. STRETCH - Flexion, Standing  Stand with good posture. With an underhand grip on your right / left hand, and an overhand grip on the opposite hand, grasp a broomstick or cane so that your hands are a little more than shoulder width apart.  Keeping your right / left elbow straight and shoulder muscles relaxed, push the stick with your opposite hand, to raise your right / left arm in front of your body and then overhead. Raise your arm until you feel a stretch in your right / left shoulder, but before you have increased shoulder pain.  Try to avoid shrugging your right / left shoulder as your arm rises, by keeping your shoulder blade tucked down and toward your mid-back spine. Hold for __________ seconds.  Slowly return to the starting position. Repeat __________ times. Complete this exercise __________ times per day. STRETCH - Abduction, Supine  Lie on your back. With an underhand grip on your right / left hand and an overhand grip on the opposite hand, grasp a broomstick or cane so that your hands are a little more than shoulder width apart.  Keeping your right / left elbow straight and your shoulder muscles relaxed, push the stick with your opposite hand, to raise your right / left arm out to the side of your body and then overhead. Raise your arm until you feel a stretch in your right / left shoulder, but before you have increased shoulder pain.  Try to avoid  shrugging your right / left shoulder as your arm rises, by keeping your shoulder blade tucked down and toward your mid-back spine. Hold for __________ seconds.  Slowly return to the starting position. Repeat __________ times. Complete this exercise __________ times per day. ROM - Flexion, Active-Assisted  Lie on your back. You may bend your knees for comfort.  Grasp a broomstick or cane so your hands are about shoulder width apart. Your right / left hand should grip the end  of the stick, so that your hand is positioned "thumbs-up," as if you were about to shake hands.  Using your healthy arm to lead, raise your right / left arm overhead, until you feel a gentle stretch in your shoulder. Hold for __________ seconds.  Use the stick to assist in returning your right / left arm to its starting position. Repeat __________ times. Complete this exercise __________ times per day.  ROM - Internal Rotation, Supine   Lie on your back on a firm surface. Place your right / left elbow about 60 degrees away from your side. Elevate your elbow with a folded towel, so that the elbow and shoulder are the same height.  Using a broomstick or cane and your strong arm, pull your right / left hand toward your body until you feel a gentle stretch, but no increase in your shoulder pain. Keep your shoulder and elbow in place throughout the exercise.  Hold for __________ seconds. Slowly return to the starting position. Repeat __________ times. Complete this exercise __________ times per day. STRETCH - Internal Rotation  Place your right / left hand behind your back, palm up.  Throw a towel or belt over your opposite shoulder. Grasp the towel with your right / left hand.  While keeping an upright posture, gently pull up on the towel, until you feel a stretch in the front of your right / left shoulder.  Avoid shrugging your right / left shoulder as your arm rises, by keeping your shoulder blade tucked down and toward your mid-back spine.  Hold for __________ seconds. Release the stretch, by lowering your healthy hand. Repeat __________ times. Complete this exercise __________ times per day. ROM - Internal Rotation   Using an underhand grip, grasp a stick behind your back with both hands.  While standing upright with good posture, slide the stick up your back until you feel a mild stretch in the front of your shoulder.  Hold for __________ seconds. Slowly return to your starting  position. Repeat __________ times. Complete this exercise __________ times per day.  STRETCH - Posterior Shoulder Capsule   Stand or sit with good posture. Grasp your right / left elbow and draw it across your chest, keeping it at the same height as your shoulder.  Pull your elbow, so your upper arm comes in closer to your chest. Pull until you feel a gentle stretch in the back of your shoulder.  Hold for __________ seconds. Repeat __________ times. Complete this exercise __________ times per day. STRENGTHENING EXERCISES - Impingement Syndrome (Rotator Cuff Tendinitis, Bursitis) These exercises may help you when beginning to rehabilitate your injury. They may resolve your symptoms with or without further involvement from your physician, physical therapist or athletic trainer. While completing these exercises, remember:  Muscles can gain both the endurance and the strength needed for everyday activities through controlled exercises.  Complete these exercises as instructed by your physician, physical therapist or athletic trainer. Increase the resistance and repetitions only as guided.  You may  experience muscle soreness or fatigue, but the pain or discomfort you are trying to eliminate should never worsen during these exercises. If this pain does get worse, stop and make sure you are following the directions exactly. If the pain is still present after adjustments, discontinue the exercise until you can discuss the trouble with your clinician.  During your recovery, avoid activity or exercises which involve actions that place your injured hand or elbow above your head or behind your back or head. These positions stress the tissues which you are trying to heal. STRENGTH - Scapular Depression and Adduction   With good posture, sit on a firm chair. Support your arms in front of you, with pillows, arm rests, or on a table top. Have your elbows in line with the sides of your body.  Gently draw your  shoulder blades down and toward your mid-back spine. Gradually increase the tension, without tensing the muscles along the top of your shoulders and the back of your neck.  Hold for __________ seconds. Slowly release the tension and relax your muscles completely before starting the next repetition.  After you have practiced this exercise, remove the arm support and complete the exercise in standing as well as sitting position. Repeat __________ times. Complete this exercise __________ times per day.  STRENGTH - Shoulder Abductors, Isometric  With good posture, stand or sit about 4-6 inches from a wall, with your right / left side facing the wall.  Bend your right / left elbow. Gently press your right / left elbow into the wall. Increase the pressure gradually, until you are pressing as hard as you can, without shrugging your shoulder or increasing any shoulder discomfort.  Hold for __________ seconds.  Release the tension slowly. Relax your shoulder muscles completely before you begin the next repetition. Repeat __________ times. Complete this exercise __________ times per day.  STRENGTH - External Rotators, Isometric  Keep your right / left elbow at your side and bend it 90 degrees.  Step into a door frame so that the outside of your right / left wrist can press against the door frame without your upper arm leaving your side.  Gently press your right / left wrist into the door frame, as if you were trying to swing the back of your hand away from your stomach. Gradually increase the tension, until you are pressing as hard as you can, without shrugging your shoulder or increasing any shoulder discomfort.  Hold for __________ seconds.  Release the tension slowly. Relax your shoulder muscles completely before you begin the next repetition. Repeat __________ times. Complete this exercise __________ times per day.  STRENGTH - Supraspinatus   Stand or sit with good posture. Grasp a __________  weight, or an exercise band or tubing, so that your hand is "thumbs-up," like you are shaking hands.  Slowly lift your right / left arm in a "V" away from your thigh, diagonally into the space between your side and straight ahead. Lift your hand to shoulder height or as far as you can, without increasing any shoulder pain. At first, many people do not lift their hands above shoulder height.  Avoid shrugging your right / left shoulder as your arm rises, by keeping your shoulder blade tucked down and toward your mid-back spine.  Hold for __________ seconds. Control the descent of your hand, as you slowly return to your starting position. Repeat __________ times. Complete this exercise __________ times per day.  STRENGTH - External Rotators  Secure a  rubber exercise band or tubing to a fixed object (table, pole) so that it is at the same height as your right / left elbow when you are standing or sitting on a firm surface.  Stand or sit so that the secured exercise band is at your uninjured side.  Bend your right / left elbow 90 degrees. Place a folded towel or small pillow under your right / left arm, so that your elbow is a few inches away from your side.  Keeping the tension on the exercise band, pull it away from your body, as if pivoting on your elbow. Be sure to keep your body steady, so that the movement is coming only from your rotating shoulder.  Hold for __________ seconds. Release the tension in a controlled manner, as you return to the starting position. Repeat __________ times. Complete this exercise __________ times per day.  STRENGTH - Internal Rotators   Secure a rubber exercise band or tubing to a fixed object (table, pole) so that it is at the same height as your right / left elbow when you are standing or sitting on a firm surface.  Stand or sit so that the secured exercise band is at your right / left side.  Bend your elbow 90 degrees. Place a folded towel or small pillow  under your right / left arm so that your elbow is a few inches away from your side.  Keeping the tension on the exercise band, pull it across your body, toward your stomach. Be sure to keep your body steady, so that the movement is coming only from your rotating shoulder.  Hold for __________ seconds. Release the tension in a controlled manner, as you return to the starting position. Repeat __________ times. Complete this exercise __________ times per day.  STRENGTH - Scapular Protractors, Standing   Stand arms length away from a wall. Place your hands on the wall, keeping your elbows straight.  Begin by dropping your shoulder blades down and toward your mid-back spine.  To strengthen your protractors, keep your shoulder blades down, but slide them forward on your rib cage. It will feel as if you are lifting the back of your rib cage away from the wall. This is a subtle motion and can be challenging to complete. Ask your caregiver for further instruction, if you are not sure you are doing the exercise correctly.  Hold for __________ seconds. Slowly return to the starting position, resting the muscles completely before starting the next repetition. Repeat __________ times. Complete this exercise __________ times per day. STRENGTH - Scapular Protractors, Supine  Lie on your back on a firm surface. Extend your right / left arm straight into the air while holding a __________ weight in your hand.  Keeping your head and back in place, lift your shoulder off the floor.  Hold for __________ seconds. Slowly return to the starting position, and allow your muscles to relax completely before starting the next repetition. Repeat __________ times. Complete this exercise __________ times per day. STRENGTH - Scapular Protractors, Quadruped  Get onto your hands and knees, with your shoulders directly over your hands (or as close as you can be, comfortably).  Keeping your elbows locked, lift the back of  your rib cage up into your shoulder blades, so your mid-back rounds out. Keep your neck muscles relaxed.  Hold this position for __________ seconds. Slowly return to the starting position and allow your muscles to relax completely before starting the next repetition. Repeat __________  times. Complete this exercise __________ times per day.  STRENGTH - Scapular Retractors  Secure a rubber exercise band or tubing to a fixed object (table, pole), so that it is at the height of your shoulders when you are either standing, or sitting on a firm armless chair.  With a palm down grip, grasp an end of the band in each hand. Straighten your elbows and lift your hands straight in front of you, at shoulder height. Step back, away from the secured end of the band, until it becomes tense.  Squeezing your shoulder blades together, draw your elbows back toward your sides, as you bend them. Keep your upper arms lifted away from your body throughout the exercise.  Hold for __________ seconds. Slowly ease the tension on the band, as you reverse the directions and return to the starting position. Repeat __________ times. Complete this exercise __________ times per day. STRENGTH - Shoulder Extensors   Secure a rubber exercise band or tubing to a fixed object (table, pole) so that it is at the height of your shoulders when you are either standing, or sitting on a firm armless chair.  With a thumbs-up grip, grasp an end of the band in each hand. Straighten your elbows and lift your hands straight in front of you, at shoulder height. Step back, away from the secured end of the band, until it becomes tense.  Squeezing your shoulder blades together, pull your hands down to the sides of your thighs. Do not allow your hands to go behind you.  Hold for __________ seconds. Slowly ease the tension on the band, as you reverse the directions and return to the starting position. Repeat __________ times. Complete this exercise  __________ times per day.  STRENGTH - Scapular Retractors and External Rotators   Secure a rubber exercise band or tubing to a fixed object (table, pole) so that it is at the height as your shoulders, when you are either standing, or sitting on a firm armless chair.  With a palm down grip, grasp an end of the band in each hand. Bend your elbows 90 degrees and lift your elbows to shoulder height, at your sides. Step back, away from the secured end of the band, until it becomes tense.  Squeezing your shoulder blades together, rotate your shoulders so that your upper arms and elbows remain stationary, but your fists travel upward to head height.  Hold for __________ seconds. Slowly ease the tension on the band, as you reverse the directions and return to the starting position. Repeat __________ times. Complete this exercise __________ times per day.  STRENGTH - Scapular Retractors and External Rotators, Rowing   Secure a rubber exercise band or tubing to a fixed object (table, pole) so that it is at the height of your shoulders, when you are either standing, or sitting on a firm armless chair.  With a palm down grip, grasp an end of the band in each hand. Straighten your elbows and lift your hands straight in front of you, at shoulder height. Step back, away from the secured end of the band, until it becomes tense.  Step 1: Squeeze your shoulder blades together. Bending your elbows, draw your hands to your chest, as if you are rowing a boat. At the end of this motion, your hands and elbow should be at shoulder height and your elbows should be out to your sides.  Step 2: Rotate your shoulders, to raise your hands above your head. Your forearms should  be vertical and your upper arms should be horizontal.  Hold for __________ seconds. Slowly ease the tension on the band, as you reverse the directions and return to the starting position. Repeat __________ times. Complete this exercise __________ times  per day.  STRENGTH - Scapular Depressors  Find a sturdy chair without wheels, such as a dining room chair.  Keeping your feet on the floor, and your hands on the chair arms, lift your bottom up from the seat, and lock your elbows.  Keeping your elbows straight, allow gravity to pull your body weight down. Your shoulders will rise toward your ears.  Raise your body against gravity by drawing your shoulder blades down your back, shortening the distance between your shoulders and ears. Although your feet should always maintain contact with the floor, your feet should progressively support less body weight, as you get stronger.  Hold for __________ seconds. In a controlled and slow manner, lower your body weight to begin the next repetition. Repeat __________ times. Complete this exercise __________ times per day.  Document Released: 06/17/2005 Document Revised: 09/09/2011 Document Reviewed: 09/29/2008 [facility] Patient Information 2015 [place], [place]. This information is not intended to replace advice given to you by your health care provider. Make sure you discuss any questions you have with your health care provider.

## 2014-10-23 ENCOUNTER — Telehealth: Payer: Self-pay | Admitting: Urgent Care

## 2014-10-23 DIAGNOSIS — E1165 Type 2 diabetes mellitus with hyperglycemia: Principal | ICD-10-CM

## 2014-10-23 DIAGNOSIS — IMO0001 Reserved for inherently not codable concepts without codable children: Secondary | ICD-10-CM

## 2014-10-23 DIAGNOSIS — E782 Mixed hyperlipidemia: Secondary | ICD-10-CM

## 2014-10-23 MED ORDER — INSULIN DETEMIR 100 UNIT/ML FLEXPEN: 20.0000 [IU] | PEN_INJECTOR | Freq: Every day | SUBCUTANEOUS | Status: DC

## 2014-10-23 MED ORDER — FENOFIBRATE 160 MG PO TABS: 160.0000 mg | ORAL_TABLET | Freq: Every day | ORAL | Status: DC

## 2014-10-23 NOTE — Telephone Encounter (Signed)
Will start patient on levemir 20 units at night. Advised patient to record blood sugar levels at least twice a day. Recheck in 1 month. Counseled patient's husband on signs of arrhythmias secondary to hyperkalemia, advised to return to clinic or call 911 immediately if she develops chest pain, n/v, abdominal pain, diaphoresis, confusion, heart racing and palpitations. Patient's husband verbalized understanding.  Will also increase fenofibrate to max dose of 160mg  up from 145mg . Patient is trying to make significant dietary/exercise changes so advised that they continue this. Patient admitted that her cholesterol would be high due previous issues with dietary non-compliance. She was not able to tolerate statin therapy previously. Will revisit this once we hopefully lower triglycerides and see an increase in her HDL. Patient's husband verbalized understanding.

## 2014-10-24 ENCOUNTER — Telehealth: Payer: Self-pay

## 2014-10-24 DIAGNOSIS — E1165 Type 2 diabetes mellitus with hyperglycemia: Principal | ICD-10-CM

## 2014-10-24 DIAGNOSIS — IMO0001 Reserved for inherently not codable concepts without codable children: Secondary | ICD-10-CM

## 2014-10-24 MED ORDER — SITAGLIPTIN PHOS-METFORMIN HCL 50-1000 MG PO TABS: ORAL_TABLET | ORAL | Status: AC

## 2014-10-24 MED ORDER — GLIPIZIDE 10 MG PO TABS: 10.0000 mg | ORAL_TABLET | Freq: Two times a day (BID) | ORAL | Status: DC

## 2014-10-24 NOTE — Telephone Encounter (Signed)
I refilled her 3 diabetes medications. Please let her know that she is to continue these and add 20 units of Levemir (basal insulin) at night.  I'm glad that she is being seen by the orthopedist this week. I will defer to them regarding her restrictions with work since they are specialists and are qualified to make decisions regarding extended leave from work.  Thank you!

## 2014-10-24 NOTE — Telephone Encounter (Signed)
Please comment on this Marissa Leach. Also, the # to the nurse was provided in previous phone call.

## 2014-10-24 NOTE — Telephone Encounter (Signed)
Pt.notified

## 2014-10-24 NOTE — Telephone Encounter (Signed)
Gurney MaxinMike mani requested the phone number of pt's nurse: DAWN at [facility] [phone]  Best number to reach pt at: [phone]

## 2014-10-24 NOTE — Telephone Encounter (Signed)
Pt wants to know if she is suppose to be taking sitaGLIPtin-metformin (JANUMET) 50-1000 MG per tablet [[id number]][[id number]] and Crestor. Her primary is Engineer, maintenance (IT)Mani. Please advise at [phone]

## 2014-10-27 NOTE — Progress Notes (Signed)
Has appt with mani in June

## 2014-11-15 ENCOUNTER — Encounter: Payer: Self-pay | Admitting: Urgent Care

## 2014-11-15 DIAGNOSIS — M79641 Pain in right hand: Secondary | ICD-10-CM

## 2014-11-15 DIAGNOSIS — M25511 Pain in right shoulder: Secondary | ICD-10-CM

## 2014-11-18 ENCOUNTER — Other Ambulatory Visit: Payer: Self-pay

## 2014-11-18 DIAGNOSIS — E782 Mixed hyperlipidemia: Secondary | ICD-10-CM

## 2014-11-18 MED ORDER — FENOFIBRATE 160 MG PO TABS: 160.0000 mg | ORAL_TABLET | Freq: Every day | ORAL | Status: DC

## 2014-12-07 ENCOUNTER — Ambulatory Visit (INDEPENDENT_AMBULATORY_CARE_PROVIDER_SITE_OTHER): Payer: 59 | Admitting: Urgent Care

## 2014-12-07 VITALS — BP 142/70 | HR 86 | Temp 98.8°F | Resp 16 | Ht 66.0 in | Wt 217.0 lb

## 2014-12-07 DIAGNOSIS — IMO0001 Reserved for inherently not codable concepts without codable children: Secondary | ICD-10-CM

## 2014-12-07 DIAGNOSIS — E1165 Type 2 diabetes mellitus with hyperglycemia: Secondary | ICD-10-CM

## 2014-12-07 LAB — GLUCOSE, POCT (MANUAL RESULT ENTRY): POC GLUCOSE: 76 mg/dL (ref 70–99)

## 2014-12-07 MED ORDER — ACCU-CHEK SOFTCLIX LANCET DEV MISC: Status: DC

## 2014-12-07 MED ORDER — GLUCOSE BLOOD VI STRP: ORAL_STRIP | Status: DC

## 2014-12-07 NOTE — Patient Instructions (Addendum)
- Please check your blood sugar first thing in the morning. I want these levels to run in between 90-120. - You should also check your blood sugar in the evening before dinner. This level should run more or less the same but not above 200. Come into the clinic if you are feeling symptoms of chest pain, shortness of breath, not urinating, nausea, vomiting, abdominal pain and your blood sugar is high (>200).   Diabetes Mellitus and Food It is important for you to manage your blood sugar (glucose) level. Your blood glucose level can be greatly affected by what you eat. Eating healthier foods in the appropriate amounts throughout the day at about the same time each day will help you control your blood glucose level. It can also help slow or prevent worsening of your diabetes mellitus. Healthy eating may even help you improve the level of your blood pressure and reach or maintain a healthy weight.  HOW CAN FOOD AFFECT ME? Carbohydrates Carbohydrates affect your blood glucose level more than any other type of food. Your dietitian will help you determine how many carbohydrates to eat at each meal and teach you how to count carbohydrates. Counting carbohydrates is important to keep your blood glucose at a healthy level, especially if you are using insulin or taking certain medicines for diabetes mellitus. Alcohol Alcohol can cause sudden decreases in blood glucose (hypoglycemia), especially if you use insulin or take certain medicines for diabetes mellitus. Hypoglycemia can be a life-threatening condition. Symptoms of hypoglycemia (sleepiness, dizziness, and disorientation) are similar to symptoms of having too much alcohol.  If your health care provider has given you approval to drink alcohol, do so in moderation and use the following guidelines:  Women should not have more than one drink per day, and men should not have more than two drinks per day. One drink is equal to:  12 oz of beer.  5 oz of  wine.  1 oz of hard liquor.  Do not drink on an empty stomach.  Keep yourself hydrated. Have water, diet soda, or unsweetened iced tea.  Regular soda, juice, and other mixers might contain a lot of carbohydrates and should be counted. WHAT FOODS ARE NOT RECOMMENDED? As you make food choices, it is important to remember that all foods are not the same. Some foods have fewer nutrients per serving than other foods, even though they might have the same number of calories or carbohydrates. It is difficult to get your body what it needs when you eat foods with fewer nutrients. Examples of foods that you should avoid that are high in calories and carbohydrates but low in nutrients include:  Trans fats (most processed foods list trans fats on the Nutrition Facts label).  Regular soda.  Juice.  Candy.  Sweets, such as cake, pie, doughnuts, and cookies.  Fried foods. WHAT FOODS CAN I EAT? Have nutrient-rich foods, which will nourish your body and keep you healthy. The food you should eat also will depend on several factors, including:  The calories you need.  The medicines you take.  Your weight.  Your blood glucose level.  Your blood pressure level.  Your cholesterol level. You also should eat a variety of foods, including:  Protein, such as meat, poultry, fish, tofu, nuts, and seeds (lean animal proteins are best).  Fruits.  Vegetables.  Dairy products, such as milk, cheese, and yogurt (low fat is best).  Breads, grains, pasta, cereal, rice, and beans.  Fats such as olive oil,  trans fat-free margarine, canola oil, avocado, and olives. DOES EVERYONE WITH DIABETES MELLITUS HAVE THE SAME MEAL PLAN? Because every person with diabetes mellitus is different, there is not one meal plan that works for everyone. It is very important that you meet with a dietitian who will help you create a meal plan that is just right for you. Document Released: 03/14/2005 Document Revised:  06/22/2013 Document Reviewed: 05/14/2013 [facility] Patient Information 2015 [place], [place]. This information is not intended to replace advice given to you by your health care provider. Make sure you discuss any questions you have with your health care provider.    Diabetes and Exercise Exercising regularly is important. It is not just about losing weight. It has many health benefits, such as:  Improving your overall fitness, flexibility, and endurance.  Increasing your bone density.  Helping with weight control.  Decreasing your body fat.  Increasing your muscle strength.  Reducing stress and tension.  Improving your overall health. People with diabetes who exercise gain additional benefits because exercise:  Reduces appetite.  Improves the body's use of blood sugar (glucose).  Helps lower or control blood glucose.  Decreases blood pressure.  Helps control blood lipids (such as cholesterol and triglycerides).  Improves the body's use of the hormone insulin by:  Increasing the body's insulin sensitivity.  Reducing the body's insulin needs.  Decreases the risk for heart disease because exercising:  Lowers cholesterol and triglycerides levels.  Increases the levels of good cholesterol (such as high-density lipoproteins [HDL]) in the body.  Lowers blood glucose levels. YOUR ACTIVITY PLAN  Choose an activity that you enjoy and set realistic goals. Your health care provider or diabetes educator can help you make an activity plan that works for you. Exercise regularly as directed by your health care provider. This includes:  Performing resistance training twice a week such as push-ups, sit-ups, lifting weights, or using resistance bands.  Performing 150 minutes of cardio exercises each week such as walking, running, or playing sports.  Staying active and spending no more than 90 minutes at one time being inactive. Even short bursts of exercise are good for you. Three  10-minute sessions spread throughout the day are just as beneficial as a single 30-minute session. Some exercise ideas include:  Taking the dog for a walk.  Taking the stairs instead of the elevator.  Dancing to your favorite song.  Doing an exercise video.  Doing your favorite exercise with a friend. RECOMMENDATIONS FOR EXERCISING WITH TYPE 1 OR TYPE 2 DIABETES   Check your blood glucose before exercising. If blood glucose levels are greater than 240 mg/dL, check for urine ketones. Do not exercise if ketones are present.  Avoid injecting insulin into areas of the body that are going to be exercised. For example, avoid injecting insulin into:  The arms when playing tennis.  The legs when jogging.  Keep a record of:  Food intake before and after you exercise.  Expected peak times of insulin action.  Blood glucose levels before and after you exercise.  The type and amount of exercise you have done.  Review your records with your health care provider. Your health care provider will help you to develop guidelines for adjusting food intake and insulin amounts before and after exercising.  If you take insulin or oral hypoglycemic agents, watch for signs and symptoms of hypoglycemia. They include:  Dizziness.  Shaking.  Sweating.  Chills.  Confusion.  Drink plenty of water while you exercise to prevent dehydration  or heat stroke. Body water is lost during exercise and must be replaced.  Talk to your health care provider before starting an exercise program to make sure it is safe for you. Remember, almost any type of activity is better than none. Document Released: 09/07/2003 Document Revised: 11/01/2013 Document Reviewed: 11/24/2012 [facility] Patient Information 2015 [place], [place]. This information is not intended to replace advice given to you by your health care provider. Make sure you discuss any questions you have with your health care provider.    Diabetes and Foot  Care Diabetes may cause you to have problems because of poor blood supply (circulation) to your feet and legs. This may cause the skin on your feet to become thinner, break easier, and heal more slowly. Your skin may become dry, and the skin may peel and crack. You may also have nerve damage in your legs and feet causing decreased feeling in them. You may not notice minor injuries to your feet that could lead to infections or more serious problems. Taking care of your feet is one of the most important things you can do for yourself.  HOME CARE INSTRUCTIONS Wear shoes at all times, even in the house. Do not go barefoot. Bare feet are easily injured. Check your feet daily for blisters, cuts, and redness. If you cannot see the bottom of your feet, use a mirror or ask someone for help. Wash your feet with warm water (do not use hot water) and mild soap. Then pat your feet and the areas between your toes until they are completely dry. Do not soak your feet as this can dry your skin. Apply a moisturizing lotion or petroleum jelly (that does not contain alcohol and is unscented) to the skin on your feet and to dry, brittle toenails. Do not apply lotion between your toes. Trim your toenails straight across. Do not dig under them or around the cuticle. File the edges of your nails with an emery board or nail file. Do not cut corns or calluses or try to remove them with medicine. Wear clean socks or stockings every day. Make sure they are not too tight. Do not wear knee-high stockings since they may decrease blood flow to your legs. Wear shoes that fit properly and have enough cushioning. To break in new shoes, wear them for just a few hours a day. This prevents you from injuring your feet. Always look in your shoes before you put them on to be sure there are no objects inside. Do not cross your legs. This may decrease the blood flow to your feet. If you find a minor scrape, cut, or break in the skin on your feet,  keep it and the skin around it clean and dry. These areas may be cleansed with mild soap and water. Do not cleanse the area with peroxide, alcohol, or iodine. When you remove an adhesive bandage, be sure not to damage the skin around it. If you have a wound, look at it several times a day to make sure it is healing. Do not use heating pads or hot water bottles. They may burn your skin. If you have lost feeling in your feet or legs, you may not know it is happening until it is too late. Make sure your health care provider performs a complete foot exam at least annually or more often if you have foot problems. Report any cuts, sores, or bruises to your health care provider immediately. SEEK MEDICAL CARE IF:  You have  an injury that is not healing. You have cuts or breaks in the skin. You have an ingrown nail. You notice redness on your legs or feet. You feel burning or tingling in your legs or feet. You have pain or cramps in your legs and feet. Your legs or feet are numb. Your feet always feel cold. SEEK IMMEDIATE MEDICAL CARE IF:  There is increasing redness, swelling, or pain in or around a wound. There is a red line that goes up your leg. Pus is coming from a wound. You develop a fever or as directed by your health care provider. You notice a bad smell coming from an ulcer or wound. Document Released: 06/14/2000 Document Revised: 02/17/2013 Document Reviewed: 11/24/2012 [facility] Patient Information 2015 [place], [place]. This information is not intended to replace advice given to you by your health care provider. Make sure you discuss any questions you have with your health care provider.

## 2014-12-07 NOTE — Progress Notes (Signed)
    MRN: [id number] DOB: 05/07/1951  Subjective:   Marissa Leach is a 64 y.o. female presenting for follow up on diabetes. She is currently managed with sit-metformin, glipizide and was started on insulin end of 09/2014, 20 units every night. She also started checking her blood sugar twice daily including morning fasting levels which have shown steady progression toward 70's-120's. She has had single elevated blood sugars following steroid injections for joint problems involving her wrist and shoulder. Today, she admits that her blurred, cloudy vision has significantly improved. She has had a few episodes of hypoglycemia, she usually eats crackers or drinks orange juice for this. She denies chest pain, shob, n/v, abdominal pain, polydipsia, polyuria, numbness or tingling in hands and feet, foot ulcers or wounds that won't heal. She has made significant dietary changes but admits that she has a biscuit or donut every now and then. She is also walking with her husband for exercise. Denies any other aggravating or relieving factors, no other questions or concerns.  Marissa Leach has a current medication list which includes the following prescription(s): aspirin, blood glucose meter kit and supplies, co q-10, fenofibrate, glipizide, insulin detemir, krill oil, lisinopril-hydrochlorothiazide, meloxicam, and sitagliptin-metformin. She is allergic to crestor and lipitor.  Marissa Leach  has a past medical history of Hypertension; Diabetes mellitus; Hyperlipidemia; and Microhematuria. Also  has past surgical history that includes Cholecystectomy and Tubal ligation.  ROS As in subjective.  Objective:   Vitals: BP 142/70 mmHg  Pulse 86  Temp(Src) 98.8 F (37.1 C)  Resp 16  Ht _0  (1.676 m)  Wt 217 lb (98.431 kg)  BMI 35.04 kg/m2  SpO2 96%  Physical Exam  Constitutional: She appears well-developed and well-nourished.  HENT:  Mouth/Throat: Oropharynx is clear and moist.  Cardiovascular: Normal rate, regular  rhythm and intact distal pulses.   Pulmonary/Chest: No respiratory distress. She has no wheezes. She has no rales. She exhibits no tenderness.  Skin: Skin is warm and dry. No rash noted. No erythema. No pallor.   Results for orders placed or performed in visit on 12/07/14 (from the past 24 hour(s))  POCT glucose (manual entry)     Status: None   Collection Time: 12/07/14  8:59 AM  Result Value Ref Range   POC Glucose 76 70 - 99 mg/dl   Assessment and Plan :   1. Diabetes mellitus type 2, uncontrolled, without complications - Significantly improved, continue current regimen of basal insulin, will recheck A1c in 6 weeks. Continue oral diabetes meds, diet modifications and exercise. Patient will continue checking blood sugar at least twice a day.  - Counseled patient on worrisome blood sugar levels and when she should come in to clinic for this. Patient verbalized understanding.  Jaynee Eagles, PA-C [facility] [phone] 12/07/2014 8:11 AM

## 2014-12-08 ENCOUNTER — Ambulatory Visit: Payer: Self-pay | Admitting: Urgent Care

## 2015-01-08 ENCOUNTER — Encounter: Payer: Self-pay | Admitting: Urgent Care

## 2015-01-08 DIAGNOSIS — M25531 Pain in right wrist: Secondary | ICD-10-CM

## 2015-01-18 ENCOUNTER — Other Ambulatory Visit: Payer: Self-pay | Admitting: Physician Assistant

## 2015-02-03 ENCOUNTER — Other Ambulatory Visit: Payer: Self-pay | Admitting: Orthopedic Surgery

## 2015-02-06 ENCOUNTER — Encounter: Payer: Self-pay | Admitting: *Deleted

## 2015-02-16 ENCOUNTER — Encounter (HOSPITAL_BASED_OUTPATIENT_CLINIC_OR_DEPARTMENT_OTHER): Payer: Self-pay | Admitting: *Deleted

## 2015-02-16 NOTE — Progress Notes (Signed)
Pt to come 02/17/15 for labs, EKG

## 2015-02-17 ENCOUNTER — Encounter (HOSPITAL_BASED_OUTPATIENT_CLINIC_OR_DEPARTMENT_OTHER)
Admission: RE | Admit: 2015-02-17 | Discharge: 2015-02-17 | Disposition: A | Discharge status: Home / Self Care | Payer: Commercial Managed Care - HMO | Source: Ambulatory Visit | Attending: Orthopedic Surgery | Admitting: Orthopedic Surgery

## 2015-02-17 DIAGNOSIS — M7501 Adhesive capsulitis of right shoulder: Secondary | ICD-10-CM | POA: Diagnosis not present

## 2015-02-17 DIAGNOSIS — E785 Hyperlipidemia, unspecified: Secondary | ICD-10-CM | POA: Diagnosis not present

## 2015-02-17 DIAGNOSIS — Z79899 Other long term (current) drug therapy: Secondary | ICD-10-CM | POA: Diagnosis not present

## 2015-02-17 DIAGNOSIS — M75111 Incomplete rotator cuff tear or rupture of right shoulder, not specified as traumatic: Secondary | ICD-10-CM | POA: Diagnosis not present

## 2015-02-17 DIAGNOSIS — E119 Type 2 diabetes mellitus without complications: Secondary | ICD-10-CM | POA: Diagnosis not present

## 2015-02-17 DIAGNOSIS — S43431A Superior glenoid labrum lesion of right shoulder, initial encounter: Secondary | ICD-10-CM | POA: Diagnosis not present

## 2015-02-17 DIAGNOSIS — Z7982 Long term (current) use of aspirin: Secondary | ICD-10-CM | POA: Diagnosis not present

## 2015-02-17 DIAGNOSIS — M7592 Shoulder lesion, unspecified, left shoulder: Secondary | ICD-10-CM | POA: Diagnosis not present

## 2015-02-17 DIAGNOSIS — R312 Other microscopic hematuria: Secondary | ICD-10-CM | POA: Diagnosis not present

## 2015-02-17 DIAGNOSIS — M75101 Unspecified rotator cuff tear or rupture of right shoulder, not specified as traumatic: Secondary | ICD-10-CM | POA: Diagnosis present

## 2015-02-17 DIAGNOSIS — Z888 Allergy status to other drugs, medicaments and biological substances status: Secondary | ICD-10-CM | POA: Diagnosis not present

## 2015-02-17 DIAGNOSIS — X58XXXA Exposure to other specified factors, initial encounter: Secondary | ICD-10-CM | POA: Diagnosis not present

## 2015-02-17 DIAGNOSIS — M7541 Impingement syndrome of right shoulder: Secondary | ICD-10-CM | POA: Diagnosis not present

## 2015-02-17 DIAGNOSIS — I10 Essential (primary) hypertension: Secondary | ICD-10-CM | POA: Diagnosis not present

## 2015-02-17 LAB — BASIC METABOLIC PANEL
Anion gap: 11 (ref 5–15)
BUN: 20 mg/dL (ref 6–20)
CALCIUM: 9.5 mg/dL (ref 8.9–10.3)
CO2: 26 mmol/L (ref 22–32)
CREATININE: 1.04 mg/dL — AB (ref 0.44–1.00)
Chloride: 98 mmol/L — ABNORMAL LOW (ref 101–111)
GFR calc non Af Amer: 56 mL/min — ABNORMAL LOW (ref >60)
Glucose, Bld: 281 mg/dL — ABNORMAL HIGH (ref 65–99)
Potassium: 5 mmol/L (ref 3.5–5.1)
SODIUM: 135 mmol/L (ref 135–145)

## 2015-02-17 NOTE — Progress Notes (Signed)
EKG reviewed by Dr. Aleene Davidson. OK for surgery Monday 02/20/15.

## 2015-02-20 ENCOUNTER — Ambulatory Visit (HOSPITAL_BASED_OUTPATIENT_CLINIC_OR_DEPARTMENT_OTHER)
Admission: RE | Admit: 2015-02-20 | Discharge: 2015-02-20 | Disposition: A | Discharge status: Home / Self Care | Payer: Commercial Managed Care - HMO | Source: Ambulatory Visit | Attending: Orthopedic Surgery | Admitting: Orthopedic Surgery

## 2015-02-20 ENCOUNTER — Ambulatory Visit (HOSPITAL_BASED_OUTPATIENT_CLINIC_OR_DEPARTMENT_OTHER): Payer: Commercial Managed Care - HMO | Admitting: Anesthesiology

## 2015-02-20 ENCOUNTER — Encounter (HOSPITAL_BASED_OUTPATIENT_CLINIC_OR_DEPARTMENT_OTHER): Payer: Self-pay

## 2015-02-20 ENCOUNTER — Encounter (HOSPITAL_BASED_OUTPATIENT_CLINIC_OR_DEPARTMENT_OTHER)
Admission: RE | Disposition: A | Discharge status: Home / Self Care | Payer: Self-pay | Source: Ambulatory Visit | Attending: Orthopedic Surgery

## 2015-02-20 DIAGNOSIS — M7501 Adhesive capsulitis of right shoulder: Secondary | ICD-10-CM | POA: Insufficient documentation

## 2015-02-20 DIAGNOSIS — Z888 Allergy status to other drugs, medicaments and biological substances status: Secondary | ICD-10-CM | POA: Insufficient documentation

## 2015-02-20 DIAGNOSIS — I10 Essential (primary) hypertension: Secondary | ICD-10-CM | POA: Insufficient documentation

## 2015-02-20 DIAGNOSIS — M75111 Incomplete rotator cuff tear or rupture of right shoulder, not specified as traumatic: Secondary | ICD-10-CM | POA: Insufficient documentation

## 2015-02-20 DIAGNOSIS — X58XXXA Exposure to other specified factors, initial encounter: Secondary | ICD-10-CM | POA: Insufficient documentation

## 2015-02-20 DIAGNOSIS — Z7982 Long term (current) use of aspirin: Secondary | ICD-10-CM | POA: Insufficient documentation

## 2015-02-20 DIAGNOSIS — Z79899 Other long term (current) drug therapy: Secondary | ICD-10-CM | POA: Insufficient documentation

## 2015-02-20 DIAGNOSIS — E119 Type 2 diabetes mellitus without complications: Secondary | ICD-10-CM | POA: Insufficient documentation

## 2015-02-20 DIAGNOSIS — R312 Other microscopic hematuria: Secondary | ICD-10-CM | POA: Insufficient documentation

## 2015-02-20 DIAGNOSIS — M7592 Shoulder lesion, unspecified, left shoulder: Secondary | ICD-10-CM | POA: Insufficient documentation

## 2015-02-20 DIAGNOSIS — E785 Hyperlipidemia, unspecified: Secondary | ICD-10-CM | POA: Insufficient documentation

## 2015-02-20 DIAGNOSIS — S43431A Superior glenoid labrum lesion of right shoulder, initial encounter: Secondary | ICD-10-CM | POA: Insufficient documentation

## 2015-02-20 DIAGNOSIS — M7541 Impingement syndrome of right shoulder: Secondary | ICD-10-CM | POA: Insufficient documentation

## 2015-02-20 HISTORY — PX: SHOULDER ARTHROSCOPY WITH BICEPSTENOTOMY: SHX6204

## 2015-02-20 HISTORY — PX: RESECTION DISTAL CLAVICAL: SHX5053

## 2015-02-20 HISTORY — PX: SHOULDER ARTHROSCOPY WITH SUBACROMIAL DECOMPRESSION: SHX5684

## 2015-02-20 HISTORY — PX: CAPSULAR RELEASE: SHX6293

## 2015-02-20 LAB — GLUCOSE, CAPILLARY
GLUCOSE-CAPILLARY: 301 mg/dL — AB (ref 65–99)
Glucose-Capillary: 260 mg/dL — ABNORMAL HIGH (ref 65–99)

## 2015-02-20 LAB — POCT HEMOGLOBIN-HEMACUE: Hemoglobin: 13 g/dL (ref 12.0–15.0)

## 2015-02-20 SURGERY — SHOULDER ARTHROSCOPY WITH SUBACROMIAL DECOMPRESSION
Anesthesia: Regional | Site: Shoulder | Laterality: Right

## 2015-02-20 MED ORDER — DEXAMETHASONE SODIUM PHOSPHATE 4 MG/ML IJ SOLN
INTRAMUSCULAR | Status: DC | PRN
  Administered 2015-02-20: 8 mg via INTRAVENOUS

## 2015-02-20 MED ORDER — CEFAZOLIN SODIUM-DEXTROSE 2-3 GM-% IV SOLR
2.0000 g | INTRAVENOUS | Status: AC
  Administered 2015-02-20: 2 g via INTRAVENOUS

## 2015-02-20 MED ORDER — FENTANYL CITRATE (PF) 100 MCG/2ML IJ SOLN
INTRAMUSCULAR | Status: AC
  Filled 2015-02-20: qty 2

## 2015-02-20 MED ORDER — DOCUSATE SODIUM 100 MG PO CAPS: 100.0000 mg | ORAL_CAPSULE | Freq: Three times a day (TID) | ORAL | Status: DC | PRN

## 2015-02-20 MED ORDER — SUCCINYLCHOLINE CHLORIDE 20 MG/ML IJ SOLN
INTRAMUSCULAR | Status: DC | PRN
  Administered 2015-02-20: 100 mg via INTRAVENOUS

## 2015-02-20 MED ORDER — PROPOFOL 10 MG/ML IV BOLUS
INTRAVENOUS | Status: DC | PRN
Start: 2015-02-20 — End: 2015-02-20
  Administered 2015-02-20: 200 mg via INTRAVENOUS

## 2015-02-20 MED ORDER — CEFAZOLIN SODIUM-DEXTROSE 2-3 GM-% IV SOLR
INTRAVENOUS | Status: AC
  Filled 2015-02-20: qty 50

## 2015-02-20 MED ORDER — POVIDONE-IODINE 7.5 % EX SOLN: Freq: Once | CUTANEOUS | Status: DC

## 2015-02-20 MED ORDER — OXYCODONE-ACETAMINOPHEN 5-325 MG PO TABS: 1.0000 | ORAL_TABLET | ORAL | Status: DC | PRN

## 2015-02-20 MED ORDER — OXYCODONE HCL 5 MG/5ML PO SOLN: 5.0000 mg | Freq: Once | ORAL | Status: DC | PRN

## 2015-02-20 MED ORDER — SODIUM CHLORIDE 0.9 % IR SOLN
Status: DC | PRN
  Administered 2015-02-20: 6000 mL

## 2015-02-20 MED ORDER — MEPERIDINE HCL 25 MG/ML IJ SOLN: 6.2500 mg | INTRAMUSCULAR | Status: DC | PRN

## 2015-02-20 MED ORDER — ONDANSETRON HCL 4 MG/2ML IJ SOLN
INTRAMUSCULAR | Status: DC | PRN
  Administered 2015-02-20: 4 mg via INTRAVENOUS

## 2015-02-20 MED ORDER — SCOPOLAMINE 1 MG/3DAYS TD PT72: 1.0000 | MEDICATED_PATCH | Freq: Once | TRANSDERMAL | Status: DC | PRN

## 2015-02-20 MED ORDER — MIDAZOLAM HCL 2 MG/2ML IJ SOLN
1.0000 mg | INTRAMUSCULAR | Status: DC | PRN
  Administered 2015-02-20: 2 mg via INTRAVENOUS

## 2015-02-20 MED ORDER — MIDAZOLAM HCL 2 MG/2ML IJ SOLN
INTRAMUSCULAR | Status: AC
  Filled 2015-02-20: qty 2

## 2015-02-20 MED ORDER — LACTATED RINGERS IV SOLN
INTRAVENOUS | Status: DC
  Administered 2015-02-20: 12:00:00 via INTRAVENOUS

## 2015-02-20 MED ORDER — LIDOCAINE HCL (CARDIAC) 10 MG/ML IV SOLN
INTRAVENOUS | Status: DC | PRN
  Administered 2015-02-20: 80 mg via INTRAVENOUS

## 2015-02-20 MED ORDER — FENTANYL CITRATE (PF) 100 MCG/2ML IJ SOLN
INTRAMUSCULAR | Status: AC
  Filled 2015-02-20: qty 4

## 2015-02-20 MED ORDER — OXYCODONE HCL 5 MG PO TABS: 5.0000 mg | ORAL_TABLET | Freq: Once | ORAL | Status: DC | PRN

## 2015-02-20 MED ORDER — GLYCOPYRROLATE 0.2 MG/ML IJ SOLN: 0.2000 mg | Freq: Once | INTRAMUSCULAR | Status: DC | PRN

## 2015-02-20 MED ORDER — HYDROMORPHONE HCL 1 MG/ML IJ SOLN: 0.2500 mg | INTRAMUSCULAR | Status: DC | PRN

## 2015-02-20 MED ORDER — BUPIVACAINE-EPINEPHRINE (PF) 0.5% -1:200000 IJ SOLN
INTRAMUSCULAR | Status: DC | PRN
  Administered 2015-02-20: 25 mL via PERINEURAL

## 2015-02-20 MED ORDER — FENTANYL CITRATE (PF) 100 MCG/2ML IJ SOLN
50.0000 ug | INTRAMUSCULAR | Status: DC | PRN
  Administered 2015-02-20: 50 ug via INTRAVENOUS
  Administered 2015-02-20: 100 ug via INTRAVENOUS

## 2015-02-20 SURGICAL SUPPLY — 80 items
BENZOIN TINCTURE PRP APPL 2/3 (GAUZE/BANDAGES/DRESSINGS) IMPLANT
BLADE CLIPPER SURG (BLADE) IMPLANT
BLADE SURG 15 STRL LF DISP TIS (BLADE) IMPLANT
BLADE SURG 15 STRL SS (BLADE)
BUR OVAL 4.0 (BURR) ×3 IMPLANT
CANNULA 5.75X71 LONG (CANNULA) ×3 IMPLANT
CANNULA TWIST IN 8.25X7CM (CANNULA) IMPLANT
CHLORAPREP W/TINT 26ML (MISCELLANEOUS) ×3 IMPLANT
CLOSURE WOUND 1/2 X4 (GAUZE/BANDAGES/DRESSINGS)
DECANTER SPIKE VIAL GLASS SM (MISCELLANEOUS) IMPLANT
DRAPE INCISE IOBAN 66X45 STRL (DRAPES) ×3 IMPLANT
DRAPE STERI 35X30 U-POUCH (DRAPES) ×3 IMPLANT
DRAPE SURG 17X23 STRL (DRAPES) ×3 IMPLANT
DRAPE U 20/CS (DRAPES) ×3 IMPLANT
DRAPE U-SHAPE 47X51 STRL (DRAPES) ×3 IMPLANT
DRAPE U-SHAPE 76X120 STRL (DRAPES) ×6 IMPLANT
DRSG PAD ABDOMINAL 8X10 ST (GAUZE/BANDAGES/DRESSINGS) ×3 IMPLANT
ELECT REM PT RETURN 9FT ADLT (ELECTROSURGICAL) ×3
ELECTRODE REM PT RTRN 9FT ADLT (ELECTROSURGICAL) ×1 IMPLANT
GAUZE SPONGE 4X4 12PLY STRL (GAUZE/BANDAGES/DRESSINGS) ×3 IMPLANT
GAUZE SPONGE 4X4 16PLY XRAY LF (GAUZE/BANDAGES/DRESSINGS) IMPLANT
GAUZE XEROFORM 1X8 LF (GAUZE/BANDAGES/DRESSINGS) ×3 IMPLANT
GLOVE BIO SURGEON STRL SZ7 (GLOVE) ×3 IMPLANT
GLOVE BIO SURGEON STRL SZ7.5 (GLOVE) ×3 IMPLANT
GLOVE BIOGEL PI IND STRL 7.0 (GLOVE) ×2 IMPLANT
GLOVE BIOGEL PI IND STRL 8 (GLOVE) ×1 IMPLANT
GLOVE BIOGEL PI INDICATOR 7.0 (GLOVE) ×4
GLOVE BIOGEL PI INDICATOR 8 (GLOVE) ×2
GLOVE ECLIPSE 6.5 STRL STRAW (GLOVE) ×6 IMPLANT
GOWN STRL REUS W/ TWL LRG LVL3 (GOWN DISPOSABLE) ×2 IMPLANT
GOWN STRL REUS W/ TWL XL LVL3 (GOWN DISPOSABLE) ×1 IMPLANT
GOWN STRL REUS W/TWL LRG LVL3 (GOWN DISPOSABLE) ×4
GOWN STRL REUS W/TWL XL LVL3 (GOWN DISPOSABLE) ×2
LASSO CRESCENT QUICKPASS (SUTURE) IMPLANT
LIQUID BAND (GAUZE/BANDAGES/DRESSINGS) IMPLANT
MANIFOLD NEPTUNE II (INSTRUMENTS) ×3 IMPLANT
NDL SUT 6 .5 CRC .975X.05 MAYO (NEEDLE) IMPLANT
NEEDLE 1/2 CIR CATGUT .05X1.09 (NEEDLE) IMPLANT
NEEDLE MAYO TAPER (NEEDLE)
NEEDLE SCORPION MULTI FIRE (NEEDLE) IMPLANT
NS IRRIG 1000ML POUR BTL (IV SOLUTION) IMPLANT
PACK ARTHROSCOPY DSU (CUSTOM PROCEDURE TRAY) ×3 IMPLANT
PACK BASIN DAY SURGERY FS (CUSTOM PROCEDURE TRAY) ×3 IMPLANT
PENCIL BUTTON HOLSTER BLD 10FT (ELECTRODE) IMPLANT
RESECTOR FULL RADIUS 4.2MM (BLADE) ×3 IMPLANT
SHEET MEDIUM DRAPE 40X70 STRL (DRAPES) IMPLANT
SLEEVE SCD COMPRESS KNEE MED (MISCELLANEOUS) ×3 IMPLANT
SLING ARM IMMOBILIZER MED (SOFTGOODS) IMPLANT
SLING ARM LRG ADULT FOAM STRAP (SOFTGOODS) IMPLANT
SLING ARM MED ADULT FOAM STRAP (SOFTGOODS) IMPLANT
SLING ARM XL FOAM STRAP (SOFTGOODS) IMPLANT
SPONGE LAP 4X18 X RAY DECT (DISPOSABLE) IMPLANT
STRIP CLOSURE SKIN 1/2X4 (GAUZE/BANDAGES/DRESSINGS) IMPLANT
SUCTION FRAZIER TIP 10 FR DISP (SUCTIONS) IMPLANT
SUPPORT WRAP ARM LG (MISCELLANEOUS) IMPLANT
SUT BONE WAX W31G (SUTURE) IMPLANT
SUT ETHIBOND 2 OS 4 DA (SUTURE) IMPLANT
SUT ETHILON 3 0 PS 1 (SUTURE) ×3 IMPLANT
SUT ETHILON 4 0 PS 2 18 (SUTURE) IMPLANT
SUT FIBERWIRE #2 38 T-5 BLUE (SUTURE)
SUT MNCRL AB 3-0 PS2 18 (SUTURE) IMPLANT
SUT MNCRL AB 4-0 PS2 18 (SUTURE) IMPLANT
SUT PDS AB 0 CT 36 (SUTURE) IMPLANT
SUT PROLENE 3 0 PS 2 (SUTURE) IMPLANT
SUT TIGER TAPE 7 IN WHITE (SUTURE) IMPLANT
SUT VIC AB 0 CT1 27 (SUTURE)
SUT VIC AB 0 CT1 27XBRD ANBCTR (SUTURE) IMPLANT
SUT VIC AB 2-0 SH 27 (SUTURE)
SUT VIC AB 2-0 SH 27XBRD (SUTURE) IMPLANT
SUTURE FIBERWR #2 38 T-5 BLUE (SUTURE) IMPLANT
SYR BULB 3OZ (MISCELLANEOUS) IMPLANT
TAPE FIBER 2MM 7IN #2 BLUE (SUTURE) IMPLANT
TOWEL OR 17X24 6PK STRL BLUE (TOWEL DISPOSABLE) ×3 IMPLANT
TOWEL OR NON WOVEN STRL DISP B (DISPOSABLE) ×3 IMPLANT
TUBE CONNECTING 20'X1/4 (TUBING) ×1
TUBE CONNECTING 20X1/4 (TUBING) ×2 IMPLANT
TUBING ARTHROSCOPY IRRIG 16FT (MISCELLANEOUS) ×3 IMPLANT
WAND STAR VAC 90 (SURGICAL WAND) ×3 IMPLANT
WATER STERILE IRR 1000ML POUR (IV SOLUTION) ×3 IMPLANT
YANKAUER SUCT BULB TIP NO VENT (SUCTIONS) IMPLANT

## 2015-02-20 NOTE — Anesthesia Procedure Notes (Addendum)
Anesthesia Regional Block:  Interscalene brachial plexus block  Pre-Anesthetic Checklist: ,, timeout performed, Correct Patient, Correct Site, Correct Laterality, Correct Procedure, Correct Position, site marked, Risks and benefits discussed,  Surgical consent,  Pre-op evaluation,  At surgeon's request and post-op pain management  Laterality: Right and Upper  Prep: chloraprep       Needles:  Injection technique: Single-shot  Needle Type: Echogenic Needle     Needle Length: 5cm 5 cm Needle Gauge: 21 and 21 G    Additional Needles:  Procedures: ultrasound guided (picture in chart) Interscalene brachial plexus block Narrative:  Start time: 02/20/2015 10:32 AM End time: 02/20/2015 10:38 AM Injection made incrementally with aspirations every 5 mL.  Performed by: Personally  Anesthesiologist: CREWS, DAVID   Procedure Name: Intubation Date/Time: 02/20/2015 12:07 PM Performed by: Gar Gibbon Pre-anesthesia Checklist: Patient identified, Emergency Drugs available, Suction available and Patient being monitored Patient Re-evaluated:Patient Re-evaluated prior to inductionOxygen Delivery Method: Circle System Utilized Preoxygenation: Pre-oxygenation with 100% oxygen Intubation Type: IV induction Ventilation: Mask ventilation without difficulty Laryngoscope Size: Miller and 2 Grade View: Grade III Tube type: Oral Tube size: 7.0 mm Number of attempts: 1 Airway Equipment and Method: Stylet and Oral airway Placement Confirmation: ETT inserted through vocal cords under direct vision,  positive ETCO2 and breath sounds checked- equal and bilateral Secured at: 20 cm Tube secured with: Tape Dental Injury: Teeth and Oropharynx as per pre-operative assessment

## 2015-02-20 NOTE — Discharge Instructions (Signed)
Discharge Instructions after Arthroscopic Shoulder Surgery ° ° °A sling has been provided for you. You may remove the sling after 72 hours. The sling may be worn for your protection, if you are in a crowd.  °Use ice on the shoulder intermittently over the first 48 hours after surgery.  °Pain medication has been prescribed for you.  °Use your medication liberally over the first 48 hours, and then begin to taper your use. You may take Extra Strength Tylenol or Tylenol only in place of the pain pills. DO NOT take ANY nonsteroidal anti-inflammatory pain medications: Advil, Motrin, Ibuprofen, Aleve, Naproxen, or Naprosyn.  °You may remove your dressing after two days.  °You may shower 5 days after surgery. The incision CANNOT get wet prior to 5 days. Simply allow the water to wash over the site and then pat dry. Do not rub the incision. Make sure your axilla (armpit) is completely dry after showering.  °Take one aspirin a day for 2 weeks after surgery, unless you have an aspirin sensitivity/allergy or asthma.  °Three to 5 times each day you should perform assisted overhead reaching and external rotation (outward turning) exercises with the operative arm. Both exercises should be done with the non-operative arm used as the "therapist arm" while the operative arm remains relaxed. Ten of each exercise should be done three to five times each day. ° ° ° °Overhead reach is helping to lift your stiff arm up as high as it will go. To stretch your overhead reach, lie flat on your back, relax, and grasp the wrist of the tight shoulder with your opposite hand. Using the power in your opposite arm, bring the stiff arm up as far as it is comfortable. Start holding it for ten seconds and then work up to where you can hold it for a count of 30. Breathe slowly and deeply while the arm is moved. Repeat this stretch ten times, trying to help the arm up a little higher each time.  ° ° ° ° ° °External rotation is turning the arm out to  the side while your elbow stays close to your body. External rotation is best stretched while you are lying on your back. Hold a cane, yardstick, broom handle, or dowel in both hands. Bend both elbows to a right angle. Use steady, gentle force from your normal arm to rotate the hand of the stiff shoulder out away from your body. Continue the rotation as far as it will go comfortably, holding it there for a count of 10. Repeat this exercise ten times.  ° ° ° °Please call [phone] during normal business hours or [phone] after hours for any problems. Including the following: ° °- excessive redness of the incisions °- drainage for more than 4 days °- fever of more than 101.5 F ° °*Please note that pain medications will not be refilled after hours or on weekends. ° ° ° °Post Anesthesia Home Care Instructions ° °Activity: °Get plenty of rest for the remainder of the day. A responsible adult should stay with you for 24 hours following the procedure.  °For the next 24 hours, DO NOT: °-Drive a car °-Operate machinery °-Drink alcoholic beverages °-Take any medication unless instructed by your physician °-Make any legal decisions or sign important papers. ° °Meals: °Start with liquid foods such as gelatin or soup. Progress to regular foods as tolerated. Avoid greasy, spicy, heavy foods. If nausea and/or vomiting occur, drink only clear liquids until the nausea and/or vomiting subsides. Call   your physician if vomiting continues. ° °Special Instructions/Symptoms: °Your throat may feel dry or sore from the anesthesia or the breathing tube placed in your throat during surgery. If this causes discomfort, gargle with warm salt water. The discomfort should disappear within 24 hours. ° °If you had a scopolamine patch placed behind your ear for the management of post- operative nausea and/or vomiting: ° °1. The medication in the patch is effective for 72 hours, after which it should be removed.  Wrap patch in a tissue and  discard in the trash. Wash hands thoroughly with soap and water. °2. You may remove the patch earlier than 72 hours if you experience unpleasant side effects which may include dry mouth, dizziness or visual disturbances. °3. Avoid touching the patch. Wash your hands with soap and water after contact with the patch. ° ° °  °Regional Anesthesia Blocks ° °1. Numbness or the inability to move the "blocked" extremity may last from 3-48 hours after placement. The length of time depends on the medication injected and your individual response to the medication. If the numbness is not going away after 48 hours, call your surgeon. ° °2. The extremity that is blocked will need to be protected until the numbness is gone and the  Strength has returned. Because you cannot feel it, you will need to take extra care to avoid injury. Because it may be weak, you may have difficulty moving it or using it. You may not know what position it is in without looking at it while the block is in effect. ° °3. For blocks in the legs and feet, returning to weight bearing and walking needs to be done carefully. You will need to wait until the numbness is entirely gone and the strength has returned. You should be able to move your leg and foot normally before you try and bear weight or walk. You will need someone to be with you when you first try to ensure you do not fall and possibly risk injury. ° °4. Bruising and tenderness at the needle site are common side effects and will resolve in a few days. ° °5. Persistent numbness or new problems with movement should be communicated to the surgeon or the [facility] ([phone])/ [facility] ([phone]). °

## 2015-02-20 NOTE — Transfer of Care (Signed)
Immediate Anesthesia Transfer of Care Note  Patient: Marissa Leach  Procedure(s) Performed: Procedure(s): RIGHT SHOULDER ARTHROSCOPY WITH ROTATOR CUFF DEBRIDEMENT ,,SUBACROMIAL DECOMPRESSION, DISTAL CLAVICAL EXCISION, CAPSULAR RELEASE (Right) SHOULDER ARTHROSCOPY WITH BICEPSTENOTOMY (Right) RESECTION DISTAL CLAVICAL (Right) CAPSULAR RELEASE (Right)  Patient Location: PACU  Anesthesia Type:GA combined with regional for post-op pain  Level of Consciousness: awake, sedated and patient cooperative  Airway & Oxygen Therapy: Patient Spontanous Breathing and Patient connected to face mask oxygen  Post-op Assessment: Report given to RN and Post -op Vital signs reviewed and stable  Post vital signs: Reviewed and stable  Last Vitals:  Filed Vitals:   02/20/15 1045  BP: 123/60  Pulse: 89  Temp:   Resp: 16    Complications: No apparent anesthesia complications

## 2015-02-20 NOTE — H&P (Signed)
Marissa Leach is an 64 y.o. female.   Chief Complaint: R shoulder pain and stiffness HPI:R shoulder pain and stiffnessm with partial thickness RCT failed conservative treatment   Past Medical History  Diagnosis Date  . Hypertension     resolved by wt loss  . Diabetes mellitus   . Hyperlipidemia   . Microhematuria     Past Surgical History  Procedure Laterality Date  . Cholecystectomy    . Tubal ligation    . Hand surgery Right age 80    cyst in palm    Family History  Problem Relation Age of Onset  . Heart disease Mother   . Heart disease Father   . Diabetes Son 67   Social History:  reports that she has never smoked. She has never used smokeless tobacco. She reports that she does not drink alcohol or use illicit drugs.  Allergies:  Allergies  Allergen Reactions  . Crestor [Rosuvastatin] Other (See Comments)    Severe muscle aches  . Lipitor [Atorvastatin]     Severe muscle and joint aches    Medications Prior to Admission  Medication Sig Dispense Refill  . aspirin 81 MG tablet Take 160 mg by mouth 2 (two) times daily.     . blood glucose meter kit and supplies KIT Dispense based on patient and insurance preference. Use up to four times daily as directed. (FOR ICD-9 250.00, 250.01). 1 each 0  . Coenzyme Q10 (CO Q-10) 400 MG CAPS Take 1 capsule by mouth daily.    . fenofibrate 160 MG tablet Take 1 tablet (160 mg total) by mouth daily. 90 tablet 0  . glipiZIDE (GLUCOTROL) 10 MG tablet Take 1 tablet (10 mg total) by mouth 2 (two) times daily before a meal. 180 tablet 3  . glucose blood test strip Use as instructed 100 each 12  . Insulin Detemir (LEVEMIR FLEXPEN) 100 UNIT/ML Pen Inject 20 Units into the skin daily at 10 pm. 15 mL 3  . KRILL OIL PO Take 500 mg by mouth.    [employer] Devices (ACCU-CHEK SOFTCLIX) lancets Check blood sugar twice daily before meals. 1 each 12  . lisinopril-hydrochlorothiazide (PRINZIDE,ZESTORETIC) 20-12.5 MG per tablet TAKE 1 TABLET BY MOUTH  DAILY. 90 tablet 0  . Omega-3 Fatty Acids (OMEGA-3 FISH OIL PO) Take 1,000 mg by mouth daily.    . sitaGLIPtin-metformin (JANUMET) 50-1000 MG per tablet Take 1 tablet twice a day with meals. 180 tablet 3  . meloxicam (MOBIC) 15 MG tablet Take 1 tablet (15 mg total) by mouth daily. 30 tablet 0    Results for orders placed or performed during the hospital encounter of 02/20/15 (from the past 48 hour(s))  Glucose, capillary     Status: Abnormal   Collection Time: 02/20/15 10:24 AM  Result Value Ref Range   Glucose-Capillary 301 (H) 65 - 99 mg/dL  Hemoglobin-hemacue, POC     Status: None   Collection Time: 02/20/15 10:26 AM  Result Value Ref Range   Hemoglobin 13.0 12.0 - 15.0 g/dL   No results found.  Review of Systems  All other systems reviewed and are negative.   Blood pressure 123/60, pulse 89, temperature 98.5 F (36.9 C), temperature source Oral, resp. rate 16, height _0  (1.676 m), weight 98.601 kg (217 lb 6 oz), SpO2 98 %. Physical Exam  Constitutional: She is oriented to person, place, and time. She appears well-developed and well-nourished.  HENT:  Head: Atraumatic.  Eyes: EOM are normal.  Cardiovascular:  Intact distal pulses.   Musculoskeletal:  R shoulder pain and stiffness with ROM  Neurological: She is alert and oriented to person, place, and time.  Skin: Skin is warm and dry.  Psychiatric: She has a normal mood and affect.     Assessment/Plan R shoulder pain and stiffnessm with partial thickness RCT failed conservative treatment Plan R arth SAD/DCR/caps release, RC debridement vs repair. Risks / benefits of surgery discussed Consent on chart  NPO for OR Preop antibiotics   Jaymin Waln WILLIAM 02/20/2015, 11:46 AM

## 2015-02-20 NOTE — Progress Notes (Signed)
Assisted Dr. Crews with right, ultrasound guided, interscalene  block. Side rails up, monitors on throughout procedure. See vital signs in flow sheet. Tolerated Procedure well. 

## 2015-02-20 NOTE — Anesthesia Postprocedure Evaluation (Signed)
  Anesthesia Post-op Note  Patient: Marissa Leach  Procedure(s) Performed: Procedure(s): RIGHT SHOULDER ARTHROSCOPY WITH ROTATOR CUFF DEBRIDEMENT ,,SUBACROMIAL DECOMPRESSION, DISTAL CLAVICAL EXCISION, CAPSULAR RELEASE (Right) SHOULDER ARTHROSCOPY WITH BICEPSTENOTOMY (Right) RESECTION DISTAL CLAVICAL (Right) CAPSULAR RELEASE (Right)  Patient Location: PACU  Anesthesia Type:General  Level of Consciousness: awake  Airway and Oxygen Therapy: Patient Spontanous Breathing  Post-op Pain: mild  Post-op Assessment: Post-op Vital signs reviewed              Post-op Vital Signs: Reviewed  Last Vitals:  Filed Vitals:   02/20/15 1345  BP: 115/86  Pulse: 75  Temp:   Resp: 16    Complications: No apparent anesthesia complications

## 2015-02-20 NOTE — Op Note (Signed)
Procedure(s):  SUMMERS BUENDIA female 64 y.o. 02/20/2015  Procedure(s) and Anesthesia Type: #1 right shoulder arthroscopic debridement partial thickness rotator cuff tear #2 right shoulder arthroscopic debridement type II SLAP tear/long head biceps tendon tear with biceps tenotomy and debridement #3 right shoulder arthroscopic subacromial decompression #4 right shoulder arthroscopic distal clavicle excision #5 manipulation under anesthesia  Surgeon(s) and Role:    * Jones Broom, MD - Primary     Surgeon: Mable Paris   Assistants: Damita Lack PA-C (Danielle was present and scrubbed throughout the procedure and was essential in positioning, assisting with the camera and instrumentation,, and closure)  Anesthesia: General endotracheal anesthesia     Procedure Detail  Estimated Blood Loss: Min         Drains: none  Blood Given: none         Specimens: none        Complications:  * No complications entered in OR log *         Disposition: PACU - hemodynamically stable.         Condition: stable    Procedure:   INDICATIONS FOR SURGERY: The patient is 64 y.o. female who has had a long history of right shoulder pain which is failed conservative management with physical therapy and injections. She was to go forward with surgical management to try and decrease pain and restore function.  OPERATIVE FINDINGS: Examination under anesthesia: she had stiffness to about 140 forward flexion but external rotation was largely intact to about 50. With a gentle manipulation was able to achieve 180 forward flexion with satisfactory lysis of adhesions.   DESCRIPTION OF PROCEDURE: The patient was identified in preoperative  holding area where I personally marked the operative site after  verifying site, side, and procedure with the patient. An interscalene block was given by the attending anesthesiologist the holding area.  The patient was taken back to the  operating room where general anesthesia was induced without complication and was placed in the beach-chair position with the back  elevated about 60 degrees and all extremities and head and neck carefully padded and  positioned.   The right upper extremity was then prepped and  draped in a standard sterile fashion. The appropriate time-out  procedure was carried out. The patient did receive IV antibiotics  within 30 minutes of incision.   A small posterior portal incision was made and the arthroscope was introduced into the joint. An anterior portal was then established above the subscapularis using needle localization. Small cannula was placed anteriorly. Diagnostic arthroscopy was then carried out.  She was noted to have moderate synovitis in the joint. The subscapularis was intact. The superior labrum had type II tearing with detachment of the biceps anchor and extensive tearing of the proximal long head biceps tendon involving the first 2 cm of tendon greater than 50% thickness. This was first debrided with a shaver and subsequently a large biter was used to release the proximal long head biceps tendon off of the superior labrum. This was debrided with a shaver. Glenohumeral joint surfaces were intact without significant chondromalacia. No loose bodies were noted. Given her stiffness I did perform a rotator interval release with the ArthriCare. The supraspinatus was examined and found to have significant partial-thickness undersurface tearing which was extensively debrided with shaver. Once the healthy tendon was encountered was found that the tear thickness was only about 20% of the articular side.  The arthroscope was then introduced into the subacromial space a standard lateral  portal was established with needle localization. The shaver was used through the lateral portal to perform extensive bursectomy. She was noted to have multiple subacromial adhesions were taken down with a shaver and  ArthriCare. The bursal surface of the rotator cuff was carefully examined and the somewhat thinned anteriorly there was no significant bursal sided tearing and no exposed tuberosity.  The coracoacromial ligament was taken down off the anterior acromion with the ArthroCare exposing a moderate hooked anterior acromial spur. A high-speed bur was then used through the lateral portal to take down the anterior acromial spur from lateral to medial in a standard acromioplasty.  The acromioplasty was also viewed from the lateral portal and the bur was used as necessary to ensure that the acromion was completely flat from posterior to anterior.  The distal clavicle was exposed arthroscopically and the bur was used to take off the undersurface for approximately 8 mm from the lateral portal. The bur was then moved to an anterior portal position to complete the distal clavicle excision resecting about 8 mm of the distal clavicle and a smooth even fashion. This was viewed from anterior and lateral portals and felt to be complete.  The arthroscopic equipment was removed from the joint and the portals were closed with 3-0 nylon in an interrupted fashion. Sterile dressings were then applied including Xeroform 4 x 4's ABDs and tape. The patient was then allowed to awaken from general anesthesia, placed in a sling, transferred to the stretcher and taken to the recovery room in stable condition.   POSTOPERATIVE PLAN: The patient will be discharged home today and will followup in one week for suture removal and wound check.  We will get her into therapy right away.

## 2015-02-20 NOTE — Anesthesia Preprocedure Evaluation (Addendum)
Anesthesia Evaluation  Patient identified by MRN, date of birth, ID band Patient awake and Patient confused    Reviewed: Allergy & Precautions, NPO status , Patient's Chart, lab work & pertinent test results  Airway Mallampati: I  TM Distance: >3 FB Neck ROM: Full    Dental  (+) Teeth Intact, Dental Advisory Given   Pulmonary  breath sounds clear to auscultation        Cardiovascular hypertension, Pt. on medications Rhythm:Regular Rate:Normal     Neuro/Psych    GI/Hepatic   Endo/Other  diabetes, Well Controlled, Type 2, Insulin DependentMorbid obesity  Renal/GU      Musculoskeletal   Abdominal   Peds  Hematology   Anesthesia Other Findings   Reproductive/Obstetrics                            Anesthesia Physical Anesthesia Plan  ASA: III  Anesthesia Plan: General and Regional   Post-op Pain Management:    Induction: Intravenous  Airway Management Planned: Oral ETT  Additional Equipment:   Intra-op Plan:   Post-operative Plan: Extubation in OR  Informed Consent: I have reviewed the patients History and Physical, chart, labs and discussed the procedure including the risks, benefits and alternatives for the proposed anesthesia with the patient or authorized representative who has indicated his/her understanding and acceptance.   Dental advisory given  Plan Discussed with: CRNA, Surgeon and Anesthesiologist  Anesthesia Plan Comments:         Anesthesia Quick Evaluation

## 2015-02-22 ENCOUNTER — Encounter (HOSPITAL_BASED_OUTPATIENT_CLINIC_OR_DEPARTMENT_OTHER): Payer: Self-pay | Admitting: Orthopedic Surgery

## 2015-02-25 ENCOUNTER — Other Ambulatory Visit: Payer: Self-pay | Admitting: Urgent Care

## 2015-03-22 ENCOUNTER — Other Ambulatory Visit: Payer: Self-pay | Admitting: Physician Assistant

## 2015-04-23 ENCOUNTER — Inpatient Hospital Stay (HOSPITAL_COMMUNITY): Payer: Commercial Managed Care - HMO | Admitting: Anesthesiology

## 2015-04-23 ENCOUNTER — Inpatient Hospital Stay (HOSPITAL_COMMUNITY): Payer: Commercial Managed Care - HMO

## 2015-04-23 ENCOUNTER — Other Ambulatory Visit: Payer: Self-pay

## 2015-04-23 ENCOUNTER — Inpatient Hospital Stay (HOSPITAL_COMMUNITY)
Admission: EM | Admit: 2015-04-23 | Discharge: 2015-04-26 | DRG: 482 | Disposition: A | Discharge status: Home Health | Payer: Commercial Managed Care - HMO | Attending: Family Medicine | Admitting: Family Medicine

## 2015-04-23 ENCOUNTER — Emergency Department (HOSPITAL_COMMUNITY): Payer: Commercial Managed Care - HMO

## 2015-04-23 ENCOUNTER — Encounter (HOSPITAL_COMMUNITY)
Admission: EM | Disposition: A | Discharge status: Home Health | Payer: Self-pay | Source: Home / Self Care | Attending: Family Medicine

## 2015-04-23 ENCOUNTER — Encounter (HOSPITAL_COMMUNITY): Payer: Self-pay | Admitting: Emergency Medicine

## 2015-04-23 DIAGNOSIS — W010XXA Fall on same level from slipping, tripping and stumbling without subsequent striking against object, initial encounter: Secondary | ICD-10-CM | POA: Diagnosis present

## 2015-04-23 DIAGNOSIS — M25519 Pain in unspecified shoulder: Secondary | ICD-10-CM | POA: Diagnosis present

## 2015-04-23 DIAGNOSIS — S72141A Displaced intertrochanteric fracture of right femur, initial encounter for closed fracture: Secondary | ICD-10-CM | POA: Diagnosis present

## 2015-04-23 DIAGNOSIS — Z833 Family history of diabetes mellitus: Secondary | ICD-10-CM | POA: Diagnosis not present

## 2015-04-23 DIAGNOSIS — E1165 Type 2 diabetes mellitus with hyperglycemia: Secondary | ICD-10-CM

## 2015-04-23 DIAGNOSIS — I10 Essential (primary) hypertension: Secondary | ICD-10-CM | POA: Diagnosis present

## 2015-04-23 DIAGNOSIS — Z7982 Long term (current) use of aspirin: Secondary | ICD-10-CM

## 2015-04-23 DIAGNOSIS — Z8249 Family history of ischemic heart disease and other diseases of the circulatory system: Secondary | ICD-10-CM

## 2015-04-23 DIAGNOSIS — Z794 Long term (current) use of insulin: Secondary | ICD-10-CM

## 2015-04-23 DIAGNOSIS — Z7984 Long term (current) use of oral hypoglycemic drugs: Secondary | ICD-10-CM | POA: Diagnosis not present

## 2015-04-23 DIAGNOSIS — E785 Hyperlipidemia, unspecified: Secondary | ICD-10-CM | POA: Diagnosis present

## 2015-04-23 DIAGNOSIS — S72141D Displaced intertrochanteric fracture of right femur, subsequent encounter for closed fracture with routine healing: Secondary | ICD-10-CM | POA: Diagnosis not present

## 2015-04-23 DIAGNOSIS — S72001D Fracture of unspecified part of neck of right femur, subsequent encounter for closed fracture with routine healing: Secondary | ICD-10-CM

## 2015-04-23 DIAGNOSIS — Z419 Encounter for procedure for purposes other than remedying health state, unspecified: Secondary | ICD-10-CM

## 2015-04-23 DIAGNOSIS — IMO0001 Reserved for inherently not codable concepts without codable children: Secondary | ICD-10-CM

## 2015-04-23 DIAGNOSIS — R739 Hyperglycemia, unspecified: Secondary | ICD-10-CM

## 2015-04-23 DIAGNOSIS — M25551 Pain in right hip: Secondary | ICD-10-CM | POA: Diagnosis present

## 2015-04-23 DIAGNOSIS — S72001A Fracture of unspecified part of neck of right femur, initial encounter for closed fracture: Secondary | ICD-10-CM | POA: Diagnosis not present

## 2015-04-23 DIAGNOSIS — S72143A Displaced intertrochanteric fracture of unspecified femur, initial encounter for closed fracture: Secondary | ICD-10-CM | POA: Diagnosis present

## 2015-04-23 HISTORY — PX: FEMUR IM NAIL: SHX1597

## 2015-04-23 LAB — CBC WITH DIFFERENTIAL/PLATELET
BASOS ABS: 0 10*3/uL (ref 0.0–0.1)
Basophils Relative: 0 %
EOS ABS: 0 10*3/uL (ref 0.0–0.7)
Eosinophils Relative: 0 %
HCT: 39.1 % (ref 36.0–46.0)
HEMOGLOBIN: 12.8 g/dL (ref 12.0–15.0)
LYMPHS ABS: 0.8 10*3/uL (ref 0.7–4.0)
Lymphocytes Relative: 6 %
MCH: 28.4 pg (ref 26.0–34.0)
MCHC: 32.7 g/dL (ref 30.0–36.0)
MCV: 86.7 fL (ref 78.0–100.0)
Monocytes Absolute: 0.3 10*3/uL (ref 0.1–1.0)
Monocytes Relative: 2 %
NEUTROS PCT: 92 %
Neutro Abs: 11.7 10*3/uL — ABNORMAL HIGH (ref 1.7–7.7)
PLATELETS: 309 10*3/uL (ref 150–400)
RBC: 4.51 MIL/uL (ref 3.87–5.11)
RDW: 13.2 % (ref 11.5–15.5)
WBC: 12.8 10*3/uL — AB (ref 4.0–10.5)

## 2015-04-23 LAB — BASIC METABOLIC PANEL
Anion gap: 12 (ref 5–15)
BUN: 20 mg/dL (ref 6–20)
CHLORIDE: 101 mmol/L (ref 101–111)
CO2: 21 mmol/L — ABNORMAL LOW (ref 22–32)
CREATININE: 1.01 mg/dL — AB (ref 0.44–1.00)
Calcium: 9.4 mg/dL (ref 8.9–10.3)
GFR calc Af Amer: >60 mL/min (ref >60)
GFR, EST NON AFRICAN AMERICAN: 58 mL/min — AB (ref >60)
Glucose, Bld: 345 mg/dL — ABNORMAL HIGH (ref 65–99)
Potassium: 4.5 mmol/L (ref 3.5–5.1)
SODIUM: 134 mmol/L — AB (ref 135–145)

## 2015-04-23 LAB — CBG MONITORING, ED: GLUCOSE-CAPILLARY: 312 mg/dL — AB (ref 65–99)

## 2015-04-23 LAB — PROTIME-INR
INR: 1.09 (ref 0.00–1.49)
PROTHROMBIN TIME: 14.3 s (ref 11.6–15.2)

## 2015-04-23 LAB — GLUCOSE, CAPILLARY
GLUCOSE-CAPILLARY: 386 mg/dL — AB (ref 65–99)
Glucose-Capillary: 370 mg/dL — ABNORMAL HIGH (ref 65–99)

## 2015-04-23 LAB — ABO/RH: ABO/RH(D): A POS

## 2015-04-23 LAB — TYPE AND SCREEN
ABO/RH(D): A POS
ANTIBODY SCREEN: NEGATIVE

## 2015-04-23 SURGERY — INSERTION, INTRAMEDULLARY ROD, FEMUR
Anesthesia: General | Site: Leg Upper | Laterality: Right

## 2015-04-23 MED ORDER — ASPIRIN EC 325 MG PO TBEC: 325.0000 mg | DELAYED_RELEASE_TABLET | Freq: Every day | ORAL | Status: DC

## 2015-04-23 MED ORDER — ONDANSETRON HCL 4 MG/2ML IJ SOLN: 4.0000 mg | Freq: Four times a day (QID) | INTRAMUSCULAR | Status: DC | PRN

## 2015-04-23 MED ORDER — FENTANYL CITRATE (PF) 250 MCG/5ML IJ SOLN
INTRAMUSCULAR | Status: AC
  Filled 2015-04-23: qty 5

## 2015-04-23 MED ORDER — INSULIN ASPART 100 UNIT/ML ~~LOC~~ SOLN
0.0000 [IU] | SUBCUTANEOUS | Status: DC
  Administered 2015-04-23: 9 [IU] via SUBCUTANEOUS

## 2015-04-23 MED ORDER — SUCCINYLCHOLINE CHLORIDE 20 MG/ML IJ SOLN
INTRAMUSCULAR | Status: AC
  Filled 2015-04-23: qty 1

## 2015-04-23 MED ORDER — HYDROMORPHONE HCL 1 MG/ML IJ SOLN
INTRAMUSCULAR | Status: AC
  Filled 2015-04-23: qty 1

## 2015-04-23 MED ORDER — PROMETHAZINE HCL 25 MG/ML IJ SOLN: 6.2500 mg | INTRAMUSCULAR | Status: DC | PRN

## 2015-04-23 MED ORDER — HYDROMORPHONE HCL 1 MG/ML IJ SOLN
0.2500 mg | INTRAMUSCULAR | Status: DC | PRN
  Administered 2015-04-23 (×4): 0.5 mg via INTRAVENOUS

## 2015-04-23 MED ORDER — MENTHOL 3 MG MT LOZG: 1.0000 | LOZENGE | OROMUCOSAL | Status: DC | PRN

## 2015-04-23 MED ORDER — ASPIRIN EC 325 MG PO TBEC
325.0000 mg | DELAYED_RELEASE_TABLET | Freq: Two times a day (BID) | ORAL | Status: DC
  Administered 2015-04-24 – 2015-04-26 (×5): 325 mg via ORAL
  Filled 2015-04-23 (×5): qty 1

## 2015-04-23 MED ORDER — ACETAMINOPHEN 650 MG RE SUPP: 650.0000 mg | Freq: Four times a day (QID) | RECTAL | Status: DC | PRN

## 2015-04-23 MED ORDER — INSULIN DETEMIR 100 UNIT/ML ~~LOC~~ SOLN: 10.0000 [IU] | Freq: Every day | SUBCUTANEOUS | Status: DC

## 2015-04-23 MED ORDER — ROCURONIUM BROMIDE 100 MG/10ML IV SOLN
INTRAVENOUS | Status: DC | PRN
  Administered 2015-04-23: 40 mg via INTRAVENOUS

## 2015-04-23 MED ORDER — FENTANYL CITRATE (PF) 250 MCG/5ML IJ SOLN
INTRAMUSCULAR | Status: DC | PRN
  Administered 2015-04-23 (×5): 50 ug via INTRAVENOUS

## 2015-04-23 MED ORDER — OXYCODONE HCL 5 MG PO TABS
5.0000 mg | ORAL_TABLET | ORAL | Status: DC | PRN
  Administered 2015-04-24 – 2015-04-25 (×5): 10 mg via ORAL
  Filled 2015-04-23 (×5): qty 2

## 2015-04-23 MED ORDER — HYDROCHLOROTHIAZIDE 12.5 MG PO CAPS
12.5000 mg | ORAL_CAPSULE | Freq: Every day | ORAL | Status: DC
  Administered 2015-04-25 – 2015-04-26 (×2): 12.5 mg via ORAL
  Filled 2015-04-23 (×3): qty 1

## 2015-04-23 MED ORDER — METOCLOPRAMIDE HCL 5 MG PO TABS: 5.0000 mg | ORAL_TABLET | Freq: Three times a day (TID) | ORAL | Status: DC | PRN

## 2015-04-23 MED ORDER — HYDROCODONE-ACETAMINOPHEN 5-325 MG PO TABS: 1.0000 | ORAL_TABLET | Freq: Four times a day (QID) | ORAL | Status: DC | PRN

## 2015-04-23 MED ORDER — OXYCODONE HCL 5 MG PO TABS: 5.0000 mg | ORAL_TABLET | Freq: Once | ORAL | Status: DC | PRN

## 2015-04-23 MED ORDER — PROPOFOL 10 MG/ML IV BOLUS
INTRAVENOUS | Status: AC
  Filled 2015-04-23: qty 20

## 2015-04-23 MED ORDER — PHENYLEPHRINE HCL 10 MG/ML IJ SOLN
INTRAMUSCULAR | Status: DC | PRN
  Administered 2015-04-23: 80 ug via INTRAVENOUS
  Administered 2015-04-23: 40 ug via INTRAVENOUS

## 2015-04-23 MED ORDER — EPHEDRINE SULFATE 50 MG/ML IJ SOLN
INTRAMUSCULAR | Status: DC | PRN
  Administered 2015-04-23: 5 mg via INTRAVENOUS

## 2015-04-23 MED ORDER — HYDROMORPHONE HCL 1 MG/ML IJ SOLN
0.5000 mg | INTRAMUSCULAR | Status: AC | PRN
  Administered 2015-04-23 (×2): 0.5 mg via INTRAVENOUS
  Filled 2015-04-23 (×2): qty 1

## 2015-04-23 MED ORDER — LISINOPRIL-HYDROCHLOROTHIAZIDE 20-12.5 MG PO TABS: 1.0000 | ORAL_TABLET | Freq: Every day | ORAL | Status: DC

## 2015-04-23 MED ORDER — ONDANSETRON HCL 4 MG/2ML IJ SOLN
INTRAMUSCULAR | Status: DC | PRN
  Administered 2015-04-23: 4 mg via INTRAVENOUS

## 2015-04-23 MED ORDER — KETOROLAC TROMETHAMINE 30 MG/ML IJ SOLN
INTRAMUSCULAR | Status: AC
  Filled 2015-04-23: qty 1

## 2015-04-23 MED ORDER — CEFAZOLIN SODIUM-DEXTROSE 2-3 GM-% IV SOLR
2.0000 g | Freq: Four times a day (QID) | INTRAVENOUS | Status: AC
  Administered 2015-04-24 (×2): 2 g via INTRAVENOUS
  Filled 2015-04-23 (×2): qty 50

## 2015-04-23 MED ORDER — METOCLOPRAMIDE HCL 5 MG/ML IJ SOLN: 5.0000 mg | Freq: Three times a day (TID) | INTRAMUSCULAR | Status: DC | PRN

## 2015-04-23 MED ORDER — MORPHINE SULFATE (PF) 2 MG/ML IV SOLN: 0.5000 mg | INTRAVENOUS | Status: DC | PRN

## 2015-04-23 MED ORDER — INSULIN ASPART 100 UNIT/ML ~~LOC~~ SOLN
SUBCUTANEOUS | Status: AC
  Filled 2015-04-23: qty 9

## 2015-04-23 MED ORDER — MIDAZOLAM HCL 2 MG/2ML IJ SOLN
INTRAMUSCULAR | Status: AC
  Filled 2015-04-23: qty 4

## 2015-04-23 MED ORDER — LIDOCAINE HCL (CARDIAC) 20 MG/ML IV SOLN
INTRAVENOUS | Status: AC
  Filled 2015-04-23: qty 10

## 2015-04-23 MED ORDER — ONDANSETRON HCL 4 MG/2ML IJ SOLN
4.0000 mg | Freq: Once | INTRAMUSCULAR | Status: AC
  Administered 2015-04-23: 4 mg via INTRAVENOUS
  Filled 2015-04-23: qty 2

## 2015-04-23 MED ORDER — 0.9 % SODIUM CHLORIDE (POUR BTL) OPTIME
TOPICAL | Status: DC | PRN
  Administered 2015-04-23: 1000 mL

## 2015-04-23 MED ORDER — ONDANSETRON HCL 4 MG PO TABS: 4.0000 mg | ORAL_TABLET | Freq: Four times a day (QID) | ORAL | Status: DC | PRN

## 2015-04-23 MED ORDER — HYDROCODONE-ACETAMINOPHEN 5-325 MG PO TABS
1.0000 | ORAL_TABLET | Freq: Four times a day (QID) | ORAL | Status: DC | PRN
  Administered 2015-04-25: 2 via ORAL
  Filled 2015-04-23: qty 2

## 2015-04-23 MED ORDER — MIDAZOLAM HCL 5 MG/5ML IJ SOLN
INTRAMUSCULAR | Status: DC | PRN
  Administered 2015-04-23 (×2): 1 mg via INTRAVENOUS

## 2015-04-23 MED ORDER — LACTATED RINGERS IV SOLN
INTRAVENOUS | Status: DC | PRN
  Administered 2015-04-23 (×2): via INTRAVENOUS

## 2015-04-23 MED ORDER — NEOSTIGMINE METHYLSULFATE 10 MG/10ML IV SOLN
INTRAVENOUS | Status: DC | PRN
  Administered 2015-04-23: 4 mg via INTRAVENOUS

## 2015-04-23 MED ORDER — GLYCOPYRROLATE 0.2 MG/ML IJ SOLN
INTRAMUSCULAR | Status: DC | PRN
  Administered 2015-04-23: 0.6 mg via INTRAVENOUS

## 2015-04-23 MED ORDER — LISINOPRIL 20 MG PO TABS
20.0000 mg | ORAL_TABLET | Freq: Every day | ORAL | Status: DC
  Administered 2015-04-25 – 2015-04-26 (×2): 20 mg via ORAL
  Filled 2015-04-23 (×3): qty 1

## 2015-04-23 MED ORDER — NEOSTIGMINE METHYLSULFATE 10 MG/10ML IV SOLN
INTRAVENOUS | Status: AC
  Filled 2015-04-23: qty 3

## 2015-04-23 MED ORDER — CEFAZOLIN SODIUM-DEXTROSE 2-3 GM-% IV SOLR
INTRAVENOUS | Status: DC | PRN
  Administered 2015-04-23: 2 g via INTRAVENOUS

## 2015-04-23 MED ORDER — KETOROLAC TROMETHAMINE 30 MG/ML IJ SOLN
30.0000 mg | Freq: Once | INTRAMUSCULAR | Status: AC
  Administered 2015-04-23: 30 mg via INTRAVENOUS

## 2015-04-23 MED ORDER — PHENOL 1.4 % MT LIQD: 1.0000 | OROMUCOSAL | Status: DC | PRN

## 2015-04-23 MED ORDER — PROPOFOL 10 MG/ML IV BOLUS
INTRAVENOUS | Status: DC | PRN
  Administered 2015-04-23: 160 mg via INTRAVENOUS

## 2015-04-23 MED ORDER — ACETAMINOPHEN 325 MG PO TABS
650.0000 mg | ORAL_TABLET | Freq: Four times a day (QID) | ORAL | Status: DC | PRN
  Administered 2015-04-25 (×2): 650 mg via ORAL
  Filled 2015-04-23 (×2): qty 2

## 2015-04-23 MED ORDER — LIDOCAINE HCL (CARDIAC) 20 MG/ML IV SOLN
INTRAVENOUS | Status: DC | PRN
  Administered 2015-04-23: 50 mg via INTRAVENOUS

## 2015-04-23 MED ORDER — OXYCODONE HCL 5 MG/5ML PO SOLN: 5.0000 mg | Freq: Once | ORAL | Status: DC | PRN

## 2015-04-23 MED ORDER — ASPIRIN 81 MG PO TABS: 81.0000 mg | ORAL_TABLET | Freq: Two times a day (BID) | ORAL | Status: DC

## 2015-04-23 SURGICAL SUPPLY — 46 items
BIT DRILL 4.3MMS DISTAL GRDTED (BIT) ×1 IMPLANT
BNDG COHESIVE 4X5 TAN STRL (GAUZE/BANDAGES/DRESSINGS) ×3 IMPLANT
BNDG GAUZE ELAST 4 BULKY (GAUZE/BANDAGES/DRESSINGS) ×3 IMPLANT
CHLORAPREP W/TINT 26ML (MISCELLANEOUS) ×3 IMPLANT
COVER PERINEAL POST (MISCELLANEOUS) ×3 IMPLANT
COVER SURGICAL LIGHT HANDLE (MISCELLANEOUS) ×3 IMPLANT
DRAPE INCISE IOBAN 66X45 STRL (DRAPES) ×3 IMPLANT
DRAPE STERI IOBAN 125X83 (DRAPES) ×3 IMPLANT
DRAPE SURG 17X23 STRL (DRAPES) ×12 IMPLANT
DRILL 4.3MMS DISTAL GRADUATED (BIT) ×3
DRSG MEPILEX BORDER 4X4 (GAUZE/BANDAGES/DRESSINGS) ×3 IMPLANT
DRSG PAD ABDOMINAL 8X10 ST (GAUZE/BANDAGES/DRESSINGS) ×3 IMPLANT
ELECT REM PT RETURN 9FT ADLT (ELECTROSURGICAL) ×3
ELECTRODE REM PT RTRN 9FT ADLT (ELECTROSURGICAL) ×1 IMPLANT
GLOVE BIO SURGEON STRL SZ 6.5 (GLOVE) ×2 IMPLANT
GLOVE BIO SURGEON STRL SZ7 (GLOVE) ×6 IMPLANT
GLOVE BIO SURGEON STRL SZ7.5 (GLOVE) ×3 IMPLANT
GLOVE BIO SURGEONS STRL SZ 6.5 (GLOVE) ×1
GLOVE BIOGEL PI IND STRL 6.5 (GLOVE) ×1 IMPLANT
GLOVE BIOGEL PI IND STRL 7.0 (GLOVE) ×1 IMPLANT
GLOVE BIOGEL PI IND STRL 8 (GLOVE) ×1 IMPLANT
GLOVE BIOGEL PI INDICATOR 6.5 (GLOVE) ×2
GLOVE BIOGEL PI INDICATOR 7.0 (GLOVE) ×2
GLOVE BIOGEL PI INDICATOR 8 (GLOVE) ×2
GOWN STRL REUS W/ TWL LRG LVL3 (GOWN DISPOSABLE) ×2 IMPLANT
GOWN STRL REUS W/TWL LRG LVL3 (GOWN DISPOSABLE) ×4
GUIDEPIN 3.2X17.5 THRD DISP (PIN) ×3 IMPLANT
GUIDEWIRE BALL NOSE 100CM (WIRE) ×3 IMPLANT
HFN RH 130 DEG 11MM X 360MM (Orthopedic Implant) ×3 IMPLANT
KIT ROOM TURNOVER OR (KITS) ×3 IMPLANT
LINER BOOT UNIVERSAL DISP (MISCELLANEOUS) ×3 IMPLANT
MANIFOLD NEPTUNE II (INSTRUMENTS) ×3 IMPLANT
NS IRRIG 1000ML POUR BTL (IV SOLUTION) ×3 IMPLANT
PACK GENERAL/GYN (CUSTOM PROCEDURE TRAY) ×3 IMPLANT
PAD ARMBOARD 7.5X6 YLW CONV (MISCELLANEOUS) ×6 IMPLANT
SCREW BONE CORTICAL 5.0X40 (Screw) ×3 IMPLANT
SCREW LAG 10.5MMX105MM HFN (Screw) ×3 IMPLANT
SCREWDRIVER HEX TIP 3.5MM (MISCELLANEOUS) ×3 IMPLANT
STAPLER VISISTAT 35W (STAPLE) ×3 IMPLANT
SUT VIC AB 1 CT1 27 (SUTURE) ×2
SUT VIC AB 1 CT1 27XBRD ANBCTR (SUTURE) ×1 IMPLANT
SUT VIC AB 2-0 CT1 27 (SUTURE) ×2
SUT VIC AB 2-0 CT1 TAPERPNT 27 (SUTURE) ×1 IMPLANT
TOWEL OR 17X24 6PK STRL BLUE (TOWEL DISPOSABLE) ×3 IMPLANT
TOWEL OR 17X26 10 PK STRL BLUE (TOWEL DISPOSABLE) ×3 IMPLANT
WATER STERILE IRR 1000ML POUR (IV SOLUTION) ×3 IMPLANT

## 2015-04-23 NOTE — Consult Note (Signed)
Reason for Consult: Evaluate right hip injury Referring Physician: Jamison Yuhasz is an 64 y.o. female.  HPI: This is a patient well-known to me who has had recent rotator cuff surgery. She was at a birthday party this morning and tripped over something in the grass, landing very hard onto her right hip. She had immediate severe pain in hip and was unable to stand up or ambulate. She was found in the emergency department to have a comminuted 4 part proximal femur fracture and I was consult to for further evaluation and management. Pain is severe with any movement, better with rest.  Past Medical History  Diagnosis Date  . Hypertension     resolved by wt loss  . Diabetes mellitus   . Hyperlipidemia   . Microhematuria     Past Surgical History  Procedure Laterality Date  . Cholecystectomy    . Tubal ligation    . Hand surgery Right age 81    cyst in palm  . Shoulder arthroscopy with subacromial decompression Right 02/20/2015    Procedure: RIGHT SHOULDER ARTHROSCOPY WITH ROTATOR CUFF DEBRIDEMENT ,,SUBACROMIAL DECOMPRESSION, DISTAL CLAVICAL EXCISION, CAPSULAR RELEASE;  Surgeon: Tania Ade, MD;  Location: [facility];  Service: Orthopedics;  Laterality: Right;  . Shoulder arthroscopy with bicepstenotomy Right 02/20/2015    Procedure: SHOULDER ARTHROSCOPY WITH BICEPSTENOTOMY;  Surgeon: Tania Ade, MD;  Location: [facility];  Service: Orthopedics;  Laterality: Right;  . Resection distal clavical Right 02/20/2015    Procedure: RESECTION DISTAL CLAVICAL;  Surgeon: Tania Ade, MD;  Location: [facility];  Service: Orthopedics;  Laterality: Right;  . Capsular release Right 02/20/2015    Procedure: CAPSULAR RELEASE;  Surgeon: Tania Ade, MD;  Location: [facility];  Service: Orthopedics;  Laterality: Right;    Family History  Problem Relation Age of Onset  . Heart disease Mother   . Heart disease Father   .  Diabetes Son 79    Social History:  reports that she has never smoked. She has never used smokeless tobacco. She reports that she does not drink alcohol or use illicit drugs.  Allergies:  Allergies  Allergen Reactions  . Crestor [Rosuvastatin] Other (See Comments)    Severe muscle aches  . Lipitor [Atorvastatin] Other (See Comments)    Severe muscle and joint aches    Medications: I have reviewed the patient's current medications.  Results for orders placed or performed during the hospital encounter of 04/23/15 (from the past 48 hour(s))  Type and screen [facility]     Status: None   Collection Time: 04/23/15  6:53 PM  Result Value Ref Range   ABO/RH(D) A POS    Antibody Screen NEG    Sample Expiration 75/64/3329   Basic metabolic panel     Status: Abnormal   Collection Time: 04/23/15  7:04 PM  Result Value Ref Range   Sodium 134 (L) 135 - 145 mmol/L   Potassium 4.5 3.5 - 5.1 mmol/L   Chloride 101 101 - 111 mmol/L   CO2 21 (L) 22 - 32 mmol/L   Glucose, Bld 345 (H) 65 - 99 mg/dL   BUN 20 6 - 20 mg/dL   Creatinine, Ser 1.01 (H) 0.44 - 1.00 mg/dL   Calcium 9.4 8.9 - 10.3 mg/dL   GFR calc non Af Amer 58 (L) >60 mL/min   GFR calc Af Amer >60 >60 mL/min    Comment: (NOTE) The eGFR has been calculated  using the CKD EPI equation. This calculation has not been validated in all clinical situations. eGFR's persistently <60 mL/min signify possible Chronic Kidney Disease.    Anion gap 12 5 - 15  CBC WITH DIFFERENTIAL     Status: Abnormal   Collection Time: 04/23/15  7:04 PM  Result Value Ref Range   WBC 12.8 (H) 4.0 - 10.5 K/uL   RBC 4.51 3.87 - 5.11 MIL/uL   Hemoglobin 12.8 12.0 - 15.0 g/dL   HCT 39.1 36.0 - 46.0 %   MCV 86.7 78.0 - 100.0 fL   MCH 28.4 26.0 - 34.0 pg   MCHC 32.7 30.0 - 36.0 g/dL   RDW 13.2 11.5 - 15.5 %   Platelets 309 150 - 400 K/uL   Neutrophils Relative % 92 %   Neutro Abs 11.7 (H) 1.7 - 7.7 K/uL   Lymphocytes Relative 6 %   Lymphs  Abs 0.8 0.7 - 4.0 K/uL   Monocytes Relative 2 %   Monocytes Absolute 0.3 0.1 - 1.0 K/uL   Eosinophils Relative 0 %   Eosinophils Absolute 0.0 0.0 - 0.7 K/uL   Basophils Relative 0 %   Basophils Absolute 0.0 0.0 - 0.1 K/uL  Protime-INR     Status: None   Collection Time: 04/23/15  7:04 PM  Result Value Ref Range   Prothrombin Time 14.3 11.6 - 15.2 seconds   INR 1.09 0.00 - 1.49    Dg Chest 1 View  04/23/2015  CLINICAL DATA:  Preop for right hip fracture. EXAM: CHEST 1 VIEW COMPARISON:  None. FINDINGS: The heart size and mediastinal contours are within normal limits. Both lungs are clear. The visualized skeletal structures are unremarkable. IMPRESSION: No acute cardiopulmonary abnormality seen. Electronically Signed   By: Marijo Conception, M.D.   On: 04/23/2015 18:45   Dg Hip Unilat  With Pelvis 2-3 Views Right  04/23/2015  CLINICAL DATA:  64 year old female with right-sided hip pain after falling over a pole in the ground at a birthday party this afternoon. Unable to move leg. EXAM: DG HIP (WITH OR WITHOUT PELVIS) 2-3V RIGHT COMPARISON:  No priors. FINDINGS: Comminuted intertrochanteric fracture of the right proximal femur with some distraction of fracture fragments and mild varus rotation (less than 10 degrees) of the right hip. Lesser trochanter fragment is approximately 12 mm medially displaced. Right femoral head remains properly located. Bony pelvis appears intact, as does the visualized portions of the left proximal femur. IMPRESSION: 1. Comminuted mildly displaced and angulated intertrochanteric fracture of the right hip, as above. Electronically Signed   By: Vinnie Langton M.D.   On: 04/23/2015 18:45    Review of Systems  All other systems reviewed and are negative.  Blood pressure 146/67, pulse 106, temperature 98.3 F (36.8 C), temperature source Oral, resp. rate 17, weight 99.791 kg (220 lb), SpO2 96 %. Physical Exam  Constitutional: She is oriented to person, place, and time.  She appears well-developed and well-nourished.  HENT:  Head: Atraumatic.  Eyes: EOM are normal.  Cardiovascular: Intact distal pulses.   Respiratory: Effort normal.  Musculoskeletal:  Right hip mildly swollen. Right lower extremity externally rotated. Distally she is neurovascularly intact. Left lower extremity atraumatic. Bilateral upper extremities without injury.  Neurological: She is alert and oriented to person, place, and time.  Skin: Skin is warm and dry.  Psychiatric: She has a normal mood and affect.    Assessment/Plan: Right comminuted 4 part proximal femur fracture Plan right trochanteric nail I think it is  possible that she may fractured through a pathologic lesion but is very difficult to tell on x-ray given the fracture. She has no known cancer history. If there is concern at the time of surgery we will take pathology specimens.  Risks / benefits of surgery discussed Consent on chart  NPO for OR Preop antibiotics   Marissa Leach WILLIAM 04/23/2015, 7:58 PM

## 2015-04-23 NOTE — ED Notes (Signed)
Patient transported to X-ray 

## 2015-04-23 NOTE — Progress Notes (Signed)
Pt from PACU alert and oriented. Blood sugar prior to arrival to the unit was 370. OR nurse endorsed giving 9 units of insulin to the patient. Will continue to monitor.

## 2015-04-23 NOTE — H&P (Signed)
[facility] History and Physical  Marissa Leach ZOX:[id number] DOB: Mar 09, 1951 DOA: 04/23/2015  Referring physician: EDP PCP: JEFFERY,CHELLE, PA-C   Chief Complaint: Fall   HPI: Marissa Leach is a 64 y.o. female who suffered a mechanical fall today at home.  Landed on R hip.  Has R leg deformity and inability to bear weight on R hip after fall.  Severe pain in R hip, worse with movement, better when not moving.  Review of Systems: Systems reviewed.  As above, otherwise negative  Past Medical History  Diagnosis Date  . Hypertension     resolved by wt loss  . Diabetes mellitus   . Hyperlipidemia   . Microhematuria    Past Surgical History  Procedure Laterality Date  . Cholecystectomy    . Tubal ligation    . Hand surgery Right age 44    cyst in palm  . Shoulder arthroscopy with subacromial decompression Right 02/20/2015    Procedure: RIGHT SHOULDER ARTHROSCOPY WITH ROTATOR CUFF DEBRIDEMENT ,,SUBACROMIAL DECOMPRESSION, DISTAL CLAVICAL EXCISION, CAPSULAR RELEASE;  Surgeon: Jones Broom, MD;  Location: [facility];  Service: Orthopedics;  Laterality: Right;  . Shoulder arthroscopy with bicepstenotomy Right 02/20/2015    Procedure: SHOULDER ARTHROSCOPY WITH BICEPSTENOTOMY;  Surgeon: Jones Broom, MD;  Location: [facility];  Service: Orthopedics;  Laterality: Right;  . Resection distal clavical Right 02/20/2015    Procedure: RESECTION DISTAL CLAVICAL;  Surgeon: Jones Broom, MD;  Location: [facility];  Service: Orthopedics;  Laterality: Right;  . Capsular release Right 02/20/2015    Procedure: CAPSULAR RELEASE;  Surgeon: Jones Broom, MD;  Location: [facility];  Service: Orthopedics;  Laterality: Right;   Social History:  reports that she has never smoked. She has never used smokeless tobacco. She reports that she does not drink alcohol or use illicit drugs.  Allergies  Allergen Reactions  . Crestor  [Rosuvastatin] Other (See Comments)    Severe muscle aches  . Lipitor [Atorvastatin] Other (See Comments)    Severe muscle and joint aches    Family History  Problem Relation Age of Onset  . Heart disease Mother   . Heart disease Father   . Diabetes Son 14     Prior to Admission medications   Medication Sig Start Date End Date Taking? Authorizing Provider  aspirin 81 MG tablet Take 81 mg by mouth 2 (two) times daily.    Yes Historical Provider, MD  Coenzyme Q10 (CO Q-10) 400 MG CAPS Take 1 capsule by mouth daily.   Yes Historical Provider, MD  fenofibrate 160 MG tablet TAKE 1 TABLET (160 MG TOTAL) BY MOUTH DAILY 02/27/15  Yes Chelle Jeffery, PA-C  glipiZIDE (GLUCOTROL) 10 MG tablet Take 1 tablet (10 mg total) by mouth 2 (two) times daily before a meal. 10/24/14  Yes Wallis Bamberg, PA-C  Insulin Detemir (LEVEMIR FLEXPEN) 100 UNIT/ML Pen Inject 20 Units into the skin daily at 10 pm. 10/23/14  Yes Wallis Bamberg, PA-C  KRILL OIL PO Take 500 mg by mouth daily.    Yes Historical Provider, MD  lisinopril-hydrochlorothiazide (PRINZIDE,ZESTORETIC) 20-12.5 MG per tablet TAKE 1 TABLET BY MOUTH DAILY. 01/19/15  Yes Chelle Jeffery, PA-C  Omega-3 Fatty Acids (OMEGA-3 FISH OIL PO) Take 1,000 mg by mouth daily.   Yes Historical Provider, MD  sitaGLIPtin-metformin (JANUMET) 50-1000 MG per tablet Take 1 tablet twice a day with meals. Patient taking differently: Take 1 tablet by mouth 2 (two) times daily with a meal. Take 1  tablet twice a day with meals. 10/24/14  Yes Wallis Bamberg, PA-C   Physical Exam: Filed Vitals:   04/23/15 1900  BP: 146/67  Pulse: 106  Temp:   Resp: 17    BP 146/67 mmHg  Pulse 106  Temp(Src) 98.3 F (36.8 C) (Oral)  Resp 17  Wt 99.791 kg (220 lb)  SpO2 96%  General Appearance:    Alert, oriented, no distress, appears stated age  Head:    Normocephalic, atraumatic  Eyes:    PERRL, EOMI, sclera non-icteric        Nose:   Nares without drainage or epistaxis. Mucosa, turbinates  normal  Throat:   Moist mucous membranes. Oropharynx without erythema or exudate.  Neck:   Supple. No carotid bruits.  No thyromegaly.  No lymphadenopathy.   Back:     No CVA tenderness, no spinal tenderness  Lungs:     Clear to auscultation bilaterally, without wheezes, rhonchi or rales  Chest wall:    No tenderness to palpitation  Heart:    Regular rate and rhythm without murmurs, gallops, rubs  Abdomen:     Soft, non-tender, nondistended, normal bowel sounds, no organomegaly  Genitalia:    deferred  Rectal:    deferred  Extremities:   No clubbing, cyanosis or edema.  Pulses:   2+ and symmetric all extremities  Skin:   Skin color, texture, turgor normal, no rashes or lesions  Lymph nodes:   Cervical, supraclavicular, and axillary nodes normal  Neurologic:   CNII-XII intact. Normal strength, sensation and reflexes      throughout    Labs on Admission:  Basic Metabolic Panel:  Recent Labs Lab 04/23/15 1904  NA 134*  K 4.5  CL 101  CO2 21*  GLUCOSE 345*  BUN 20  CREATININE 1.01*  CALCIUM 9.4   Liver Function Tests: No results for input(s): AST, ALT, ALKPHOS, BILITOT, PROT, ALBUMIN in the last 168 hours. No results for input(s): LIPASE, AMYLASE in the last 168 hours. No results for input(s): AMMONIA in the last 168 hours. CBC:  Recent Labs Lab 04/23/15 1904  WBC 12.8*  NEUTROABS 11.7*  HGB 12.8  HCT 39.1  MCV 86.7  PLT 309   Cardiac Enzymes: No results for input(s): CKTOTAL, CKMB, CKMBINDEX, TROPONINI in the last 168 hours.  BNP (last 3 results) No results for input(s): PROBNP in the last 8760 hours. CBG: No results for input(s): GLUCAP in the last 168 hours.  Radiological Exams on Admission: Dg Chest 1 View  04/23/2015  CLINICAL DATA:  Preop for right hip fracture. EXAM: CHEST 1 VIEW COMPARISON:  None. FINDINGS: The heart size and mediastinal contours are within normal limits. Both lungs are clear. The visualized skeletal structures are unremarkable.  IMPRESSION: No acute cardiopulmonary abnormality seen. Electronically Signed   By: Lupita Raider, M.D.   On: 04/23/2015 18:45   Dg Hip Unilat  With Pelvis 2-3 Views Right  04/23/2015  CLINICAL DATA:  64 year old female with right-sided hip pain after falling over a pole in the ground at a birthday party this afternoon. Unable to move leg. EXAM: DG HIP (WITH OR WITHOUT PELVIS) 2-3V RIGHT COMPARISON:  No priors. FINDINGS: Comminuted intertrochanteric fracture of the right proximal femur with some distraction of fracture fragments and mild varus rotation (less than 10 degrees) of the right hip. Lesser trochanter fragment is approximately 12 mm medially displaced. Right femoral head remains properly located. Bony pelvis appears intact, as does the visualized portions of the left proximal  femur. IMPRESSION: 1. Comminuted mildly displaced and angulated intertrochanteric fracture of the right hip, as above. Electronically Signed   By: Trudie Reedaniel  Entrikin M.D.   On: 04/23/2015 18:45    EKG: Independently reviewed.  Assessment/Plan Principal Problem:   Closed right hip fracture (HCC) Active Problems:   Diabetes mellitus type 2, uncontrolled, without complications (HCC)   1. Closed R hip fx - 1. Ortho seeing patient at bedside in ED 2. Possibly to OR tonight (if not then tomorrow AM) 3. Patient NPO 4. Hip fx pathway 2. DM2 - 1. Will give half home levemir dose 2. Hold PO hypoglycemics 3. DVT ppx - ASA and SCDs ordered for now, increase to stronger anticoagulation post-op when able. 1. SSI q4h while NPO    Code Status: Full Code  Family Communication: Family at bedside Disposition Plan: Admit to inpatient   Time spent: 50 min  GARDNER, JARED M. [facility] Pager [phone]  If 7AM-7PM, please contact the day team taking care of the patient [email or web address] Password [email or web address] 04/23/2015, 8:03 PM

## 2015-04-23 NOTE — ED Provider Notes (Signed)
CSN: [id number]     Arrival date & time 04/23/15  1702 History   First MD Initiated Contact with Patient 04/23/15 1711     Chief Complaint  Patient presents with  . Hip Pain     (Consider location/radiation/quality/duration/timing/severity/associated sxs/prior Treatment) HPI Comments: Patient presents to the emergency department with complaint of right hip pain starting acutely just prior to arrival when she tripped on a stake in the ground and fell onto her right side. She denies hitting her head or losing consciousness. EMS was called for transport to hospital. She was placed in a KED. Patient denies headache, neck pain, vomiting. Patient has a history of rotator cuff surgery performed in 8/16 by Dr. Tamera Punt. She is not on any blood thinner medications. No other treatments prior to arrival. No other medical complaints. The course is constant. Aggravating factors: Movement. Alleviating factors: none.   The history is provided by the patient.    Past Medical History  Diagnosis Date  . Hypertension     resolved by wt loss  . Diabetes mellitus   . Hyperlipidemia   . Microhematuria    Past Surgical History  Procedure Laterality Date  . Cholecystectomy    . Tubal ligation    . Hand surgery Right age 64    cyst in palm  . Shoulder arthroscopy with subacromial decompression Right 02/20/2015    Procedure: RIGHT SHOULDER ARTHROSCOPY WITH ROTATOR CUFF DEBRIDEMENT ,,SUBACROMIAL DECOMPRESSION, DISTAL CLAVICAL EXCISION, CAPSULAR RELEASE;  Surgeon: Tania Ade, MD;  Location: [facility];  Service: Orthopedics;  Laterality: Right;  . Shoulder arthroscopy with bicepstenotomy Right 02/20/2015    Procedure: SHOULDER ARTHROSCOPY WITH BICEPSTENOTOMY;  Surgeon: Tania Ade, MD;  Location: [place] [facility];  Service: Orthopedics;  Laterality: Right;  . Resection distal clavical Right 02/20/2015    Procedure: RESECTION DISTAL CLAVICAL;  Surgeon: Tania Ade, MD;   Location: [place];  Service: Orthopedics;  Laterality: Right;  . Capsular release Right 02/20/2015    Procedure: CAPSULAR RELEASE;  Surgeon: Tania Ade, MD;  Location: [facility];  Service: Orthopedics;  Laterality: Right;   Family History  Problem Relation Age of Onset  . Heart disease Mother   . Heart disease Father   . Diabetes Son 65   Social History  Substance Use Topics  . Smoking status: Never Smoker   . Smokeless tobacco: Never Used  . Alcohol Use: No   OB History    No data available     Review of Systems  Constitutional: Negative for fever.  HENT: Negative for rhinorrhea and sore throat.   Eyes: Negative for redness.  Respiratory: Negative for cough.   Cardiovascular: Negative for chest pain.  Gastrointestinal: Negative for nausea, vomiting, abdominal pain and diarrhea.  Genitourinary: Negative for dysuria.  Musculoskeletal: Positive for myalgias, arthralgias and gait problem.  Skin: Negative for rash.  Neurological: Negative for headaches.      Allergies  Crestor and Lipitor  Home Medications   Prior to Admission medications   Medication Sig Start Date End Date Taking? Authorizing Provider  aspirin 81 MG tablet Take 160 mg by mouth 2 (two) times daily.     Historical Provider, MD  blood glucose meter kit and supplies KIT Dispense based on patient and insurance preference. Use up to four times daily as directed. (FOR ICD-9 250.00, 250.01). 10/22/14   Jaynee Eagles, PA-C  Coenzyme Q10 (CO Q-10) 400 MG CAPS Take 1 capsule by mouth daily.    Historical  Provider, MD  docusate sodium (COLACE) 100 MG capsule Take 1 capsule (100 mg total) by mouth 3 (three) times daily as needed. 02/20/15   Grier Mitts, PA-C  fenofibrate 160 MG tablet TAKE 1 TABLET (160 MG TOTAL) BY MOUTH DAILY 02/27/15   Chelle Jeffery, PA-C  glipiZIDE (GLUCOTROL) 10 MG tablet Take 1 tablet (10 mg total) by mouth 2 (two) times daily before a meal. 10/24/14    Jaynee Eagles, PA-C  glucose blood test strip Use as instructed 12/07/14   Jaynee Eagles, PA-C  Insulin Detemir (LEVEMIR FLEXPEN) 100 UNIT/ML Pen Inject 20 Units into the skin daily at 10 pm. 10/23/14   Jaynee Eagles, PA-C  KRILL OIL PO Take 500 mg by mouth.    Historical Provider, MD  Lancet Devices [facility]) lancets Check blood sugar twice daily before meals. 12/07/14   Jaynee Eagles, PA-C  lisinopril-hydrochlorothiazide (PRINZIDE,ZESTORETIC) 20-12.5 MG per tablet TAKE 1 TABLET BY MOUTH DAILY. 01/19/15   Chelle Jeffery, PA-C  meloxicam (MOBIC) 15 MG tablet Take 1 tablet (15 mg total) by mouth daily. 07/25/14   Chelle Jeffery, PA-C  Omega-3 Fatty Acids (OMEGA-3 FISH OIL PO) Take 1,000 mg by mouth daily.    Historical Provider, MD  oxyCODONE-acetaminophen (ROXICET) 5-325 MG per tablet Take 1-2 tablets by mouth every 4 (four) hours as needed for severe pain. 02/20/15   Grier Mitts, PA-C  sitaGLIPtin-metformin (JANUMET) 50-1000 MG per tablet Take 1 tablet twice a day with meals. 10/24/14   Jaynee Eagles, PA-C   BP 155/85 mmHg  Pulse 93  Temp(Src) 98.3 F (36.8 C) (Oral)  Resp 20  Wt 220 lb (99.791 kg)  SpO2 97%   Physical Exam  Constitutional: She appears well-developed and well-nourished.  HENT:  Head: Normocephalic and atraumatic.  Eyes: Conjunctivae are normal. Right eye exhibits no discharge. Left eye exhibits no discharge.  Neck: Normal range of motion. Neck supple.  Cardiovascular: Normal rate, regular rhythm and normal heart sounds.   No murmur heard. Pulmonary/Chest: Effort normal and breath sounds normal. No respiratory distress. She has no wheezes. She has no rales.  Abdominal: Soft. There is no tenderness.  Musculoskeletal: She exhibits tenderness.       Right hip: She exhibits decreased range of motion, decreased strength, tenderness and bony tenderness.       Right knee: Normal.       Right ankle: Normal.       Right upper leg: She exhibits tenderness. She exhibits no bony  tenderness and no swelling.       Right lower leg: Normal.       Right foot: Normal.  Right lower extremity is foreshortened and externally rotated. Left lower extremity appears normal.  Neurological: She is alert.  Skin: Skin is warm and dry.  Psychiatric: She has a normal mood and affect.  Nursing note and vitals reviewed.   ED Course  Procedures (including critical care time) Labs Review Labs Reviewed  BASIC METABOLIC PANEL - Abnormal; Notable for the following:    Sodium 134 (*)    CO2 21 (*)    Glucose, Bld 345 (*)    Creatinine, Ser 1.01 (*)    GFR calc non Af Amer 58 (*)    All other components within normal limits  CBC WITH DIFFERENTIAL/PLATELET - Abnormal; Notable for the following:    WBC 12.8 (*)    Neutro Abs 11.7 (*)    All other components within normal limits  PROTIME-INR  TYPE AND SCREEN  ABO/RH  Imaging Review Dg Chest 1 View  04/23/2015  CLINICAL DATA:  Preop for right hip fracture. EXAM: CHEST 1 VIEW COMPARISON:  None. FINDINGS: The heart size and mediastinal contours are within normal limits. Both lungs are clear. The visualized skeletal structures are unremarkable. IMPRESSION: No acute cardiopulmonary abnormality seen. Electronically Signed   By: Marijo Conception, M.D.   On: 04/23/2015 18:45   Dg Hip Unilat  With Pelvis 2-3 Views Right  04/23/2015  CLINICAL DATA:  64 year old female with right-sided hip pain after falling over a pole in the ground at a birthday party this afternoon. Unable to move leg. EXAM: DG HIP (WITH OR WITHOUT PELVIS) 2-3V RIGHT COMPARISON:  No priors. FINDINGS: Comminuted intertrochanteric fracture of the right proximal femur with some distraction of fracture fragments and mild varus rotation (less than 10 degrees) of the right hip. Lesser trochanter fragment is approximately 12 mm medially displaced. Right femoral head remains properly located. Bony pelvis appears intact, as does the visualized portions of the left proximal femur.  IMPRESSION: 1. Comminuted mildly displaced and angulated intertrochanteric fracture of the right hip, as above. Electronically Signed   By: Vinnie Langton M.D.   On: 04/23/2015 18:45   I have personally reviewed and evaluated these images and lab results as part of my medical decision-making.    5:17 PM Patient seen and examined. Hip fx work-up initialized. Pain currently controlled.   Vital signs reviewed and are as follows: BP 155/85 mmHg  Pulse 93  Temp(Src) 98.3 F (36.8 C) (Oral)  Resp 20  Wt 220 lb (99.791 kg)  SpO2 97%  7:35 PM Pt updated on results. She is comfortable if she is not moving. I have spoken with Dr. Ann Held who will see patient tonight. Possibility of ORIF tonight.   EKG documented by Dr. Rogene Houston.  MDM   Final diagnoses:  Closed intertrochanteric fracture of hip, right, initial encounter ([place])  Hyperglycemia   Admit.     Carlisle Cater, PA-C 04/23/15 2006  Fredia Sorrow, MD 04/28/15 [phone]

## 2015-04-23 NOTE — ED Notes (Signed)
Pt undressed besides hospital gown and yellow fall socks, ready for OR.

## 2015-04-23 NOTE — Op Note (Signed)
Procedure(s): INTRAMEDULLARY (IM) NAIL FEMORAL Procedure Note  AALYSSA ELDERKIN female 64 y.o. 04/23/2015  Procedure(s) and Anesthesia Type:    * RIGHT INTRAMEDULLARY (IM) TROCHANTERIC NAIL FEMORAL - General  Surgeon(s) and Role:    * Jones Broom, MD - Primary   Indications:  64 y.o. female s/p fall with right hip fracture. Indicated for surgery to promote early ambulation, pain control and prevent complications of bed rest.     Surgeon: Mable Paris   Assistants: Damita Lack PA-C (Danielle was present and scrubbed throughout the procedure and was essential in positioning, retraction, exposure, and closure)  Anesthesia: General endotracheal anesthesia    Procedure Detail  INTRAMEDULLARY (IM) NAIL FEMORAL  Findin Comminuted four-part proximal femoral fracture with near-anatomic alignment after fixation with Biomet affixis nail, 11 x 360, one distal interlocking screw   Estimated Blood Loss:  200 mL         Drains: none  Blood Given: none          Specimens: none        Complications:  * No complications entered in OR log *         Disposition: PACU - hemodynamically stable.         Condition: stable    Procedure:  The patient was identified in the preoperative holding area  where I personally marked the operative site after verifying site, side,  and procedure with the patient. She was taken back to the operating  room where general anesthesia was induced without complication. She was  placed on the fracture table with the right lower extremity in traction,  and opposite lower extremity in a flexed abducted position. The arms were well  padded. Fluoroscopic imaging was used to verify reduction with gentle traction  and internal rotation. The right hip was then prepped and draped in the standard sterile fashion. An approximately 3 cm incision was made proximal  to the palpable greater trochanter tip. Dissection was carried down to  the tip  and the short guidewire was placed under fluoroscopic imaging.  The proximal entry reamer was used to open the canal and the ball-tipped  guidewire was then placed and advanced down the femoral canal. This was  used to obtain a measurement for the implant and then was subsequently  over reamed up to 12.5  mm by 0.5 mm increments. The 11 x 360 mm nail was then  advanced over the guidewire without difficulty and the proximal jig was then placed and a small 1.5 cm incision was made on the lateral thigh to  advance the lag screw guide against the lateral aspect of the femur.  The guide pin was advanced and its position was verified in AP and  lateral planes to be centered in the head.  The guidewire was over  reamed and the appropriate size lag screw was advanced. The  proximal set screw was then advanced,and locked in place. AP and lateral imaging demonstrated appropriate position of  the screw and reduction of the fracture. The proximal jig was then  removed. Attention was turned to the knee. Where using perfect  circles technique on the lateral x-ray, one distal interlocking screw  was placed from lateral to medial through a percutaneous incision.  Final fluoroscopic imaging in AP and lateral planes at the hip and the  knee demonstrated near anatomic reduction with appropriate length and  position of the hardware. All wounds were then copiously irrigated with  normal saline and subsequently closed in layers  with #1 Vicryl in a deep  fascia layer, 2-0 Vicryl in a deep dermal layer, and staples for skin  closure. Sterile dressings were then applied including 4x4s and Mepilex  dressings. The patient was then taken off the fracture table,  transferred to the stretcher, and taken to the recovery room in stable  condition after she was extubated.   POSTOPERATIVE PLAN: She will be weightbearing as tolerated on the operative extremity. .She will have DVT prophylaxis of aspirin twice daily and  SCDs.

## 2015-04-23 NOTE — ED Notes (Signed)
Patient was a birthday party and fell and over an object in the ground on the right hip. Right leg shorting and rotation noted. Patient alert and Oriented on arrival. Patient denies any LOC. Patient denies hitting her head and denies being on any blood thinner. Per EMS pulse are intact. Patient was given 100mcg with EMS. Vitals: 118/65 HR94 RR16 02 96% RA.  Patient rates pain 2/10 without movement.

## 2015-04-23 NOTE — Anesthesia Procedure Notes (Signed)
Procedure Name: Intubation Date/Time: 04/23/2015 9:06 PM Performed by: Brien MatesMAHONY, Edwyn Inclan D Pre-anesthesia Checklist: Patient identified, Emergency Drugs available, Suction available, Patient being monitored and Timeout performed Patient Re-evaluated:Patient Re-evaluated prior to inductionOxygen Delivery Method: Circle system utilized Preoxygenation: Pre-oxygenation with 100% oxygen Intubation Type: IV induction Ventilation: Mask ventilation without difficulty and Oral airway inserted - appropriate to patient size Laryngoscope Size: Miller and 2 Grade View: Grade II Tube type: Oral Tube size: 7.5 mm Number of attempts: 1 Airway Equipment and Method: Stylet Placement Confirmation: ETT inserted through vocal cords under direct vision,  positive ETCO2 and breath sounds checked- equal and bilateral Secured at: 21 cm Tube secured with: Tape Dental Injury: Teeth and Oropharynx as per pre-operative assessment

## 2015-04-23 NOTE — Transfer of Care (Signed)
Immediate Anesthesia Transfer of Care Note  Patient: Marissa Leach  Procedure(s) Performed: Procedure(s): INTRAMEDULLARY (IM) NAIL FEMORAL (Right)  Patient Location: PACU  Anesthesia Type:General  Level of Consciousness: awake  Airway & Oxygen Therapy: Patient Spontanous Breathing and Patient connected to face mask oxygen  Post-op Assessment: Report given to RN and Post -op Vital signs reviewed and stable  Post vital signs: Reviewed and stable  Last Vitals:  Filed Vitals:   04/23/15 1945  BP: 157/74  Pulse: 114  Temp:   Resp: 18    Complications: No apparent anesthesia complications

## 2015-04-23 NOTE — Anesthesia Preprocedure Evaluation (Addendum)
Anesthesia Evaluation  Patient identified by MRN, date of birth, ID band Patient awake    Reviewed: Allergy & Precautions, NPO status , Patient's Chart, lab work & pertinent test results  Airway Mallampati: III  TM Distance: >3 FB Neck ROM: Full    Dental  (+) Dental Advisory Given, Teeth Intact   Pulmonary neg pulmonary ROS,    breath sounds clear to auscultation       Cardiovascular hypertension, Pt. on medications  Rhythm:Regular Rate:Normal     Neuro/Psych negative neurological ROS     GI/Hepatic negative GI ROS, Neg liver ROS,   Endo/Other  diabetes, Type 2, Oral Hypoglycemic Agents  Renal/GU negative Renal ROS     Musculoskeletal  (+) Arthritis ,   Abdominal   Peds  Hematology negative hematology ROS (+)   Anesthesia Other Findings   Reproductive/Obstetrics                          Anesthesia Physical Anesthesia Plan  ASA: II  Anesthesia Plan: General   Post-op Pain Management:    Induction: Intravenous  Airway Management Planned: Oral ETT  Additional Equipment:   Intra-op Plan:   Post-operative Plan: Extubation in OR  Informed Consent: I have reviewed the patients History and Physical, chart, labs and discussed the procedure including the risks, benefits and alternatives for the proposed anesthesia with the patient or authorized representative who has indicated his/her understanding and acceptance.   Dental advisory given  Plan Discussed with: CRNA, Anesthesiologist and Surgeon  Anesthesia Plan Comments:        Anesthesia Quick Evaluation

## 2015-04-23 NOTE — ED Notes (Signed)
OR ready for patient; report given to anesthesia

## 2015-04-23 NOTE — Progress Notes (Signed)
D;notified Over head frame to ortho tech, not available now, will bring to Pt's room when available.

## 2015-04-23 NOTE — ED Provider Notes (Signed)
ED ECG REPORT   Date: 04/23/2015  Rate: 98  Rhythm: normal sinus rhythm  QRS Axis: normal  Intervals: normal  ST/T Wave abnormalities: nonspecific ST/T changes  Conduction Disutrbances:none  Narrative Interpretation:   Old EKG Reviewed: none available Correction left axis.    I have personally reviewed the EKG tracing and agree with the computerized printout as noted.   Vanetta MuldersScott Jasslyn Finkel, MD 04/23/15 1758

## 2015-04-24 ENCOUNTER — Encounter (HOSPITAL_COMMUNITY): Payer: Self-pay | Admitting: Orthopedic Surgery

## 2015-04-24 DIAGNOSIS — S72001D Fracture of unspecified part of neck of right femur, subsequent encounter for closed fracture with routine healing: Secondary | ICD-10-CM

## 2015-04-24 LAB — GLUCOSE, CAPILLARY
GLUCOSE-CAPILLARY: 428 mg/dL — AB (ref 65–99)
GLUCOSE-CAPILLARY: 433 mg/dL — AB (ref 65–99)
Glucose-Capillary: 406 mg/dL — ABNORMAL HIGH (ref 65–99)
Glucose-Capillary: 358 mg/dL — ABNORMAL HIGH (ref 65–99)
Glucose-Capillary: 357 mg/dL — ABNORMAL HIGH (ref 65–99)
Glucose-Capillary: 320 mg/dL — ABNORMAL HIGH (ref 65–99)

## 2015-04-24 LAB — BASIC METABOLIC PANEL
Anion gap: 10 (ref 5–15)
BUN: 19 mg/dL (ref 6–20)
CALCIUM: 8.8 mg/dL — AB (ref 8.9–10.3)
CHLORIDE: 99 mmol/L — AB (ref 101–111)
CO2: 26 mmol/L (ref 22–32)
CREATININE: 1.18 mg/dL — AB (ref 0.44–1.00)
GFR calc non Af Amer: 48 mL/min — ABNORMAL LOW (ref >60)
GFR, EST AFRICAN AMERICAN: 56 mL/min — AB (ref >60)
GLUCOSE: 426 mg/dL — AB (ref 65–99)
Potassium: 4.8 mmol/L (ref 3.5–5.1)
Sodium: 135 mmol/L (ref 135–145)

## 2015-04-24 LAB — CBC
HEMATOCRIT: 33.8 % — AB (ref 36.0–46.0)
HEMOGLOBIN: 10.9 g/dL — AB (ref 12.0–15.0)
MCH: 28.2 pg (ref 26.0–34.0)
MCHC: 32.2 g/dL (ref 30.0–36.0)
MCV: 87.3 fL (ref 78.0–100.0)
Platelets: 369 10*3/uL (ref 150–400)
RBC: 3.87 MIL/uL (ref 3.87–5.11)
RDW: 13.3 % (ref 11.5–15.5)
WBC: 11.1 10*3/uL — ABNORMAL HIGH (ref 4.0–10.5)

## 2015-04-24 MED ORDER — INSULIN DETEMIR 100 UNIT/ML ~~LOC~~ SOLN
30.0000 [IU] | Freq: Every day | SUBCUTANEOUS | Status: DC
  Administered 2015-04-24 – 2015-04-25 (×2): 30 [IU] via SUBCUTANEOUS
  Filled 2015-04-24 (×3): qty 0.3

## 2015-04-24 MED ORDER — GLIPIZIDE 5 MG PO TABS
10.0000 mg | ORAL_TABLET | Freq: Two times a day (BID) | ORAL | Status: DC
  Administered 2015-04-24 – 2015-04-25 (×4): 10 mg via ORAL
  Filled 2015-04-24 (×4): qty 2

## 2015-04-24 MED ORDER — HYDROCODONE-ACETAMINOPHEN 5-325 MG PO TABS: 1.0000 | ORAL_TABLET | ORAL | Status: AC | PRN

## 2015-04-24 MED ORDER — INSULIN ASPART 100 UNIT/ML ~~LOC~~ SOLN
20.0000 [IU] | Freq: Once | SUBCUTANEOUS | Status: AC
  Administered 2015-04-24: 20 [IU] via SUBCUTANEOUS

## 2015-04-24 MED ORDER — INSULIN ASPART 100 UNIT/ML ~~LOC~~ SOLN
6.0000 [IU] | Freq: Three times a day (TID) | SUBCUTANEOUS | Status: DC
  Administered 2015-04-24 – 2015-04-26 (×6): 6 [IU] via SUBCUTANEOUS

## 2015-04-24 MED ORDER — LINAGLIPTIN 5 MG PO TABS
5.0000 mg | ORAL_TABLET | Freq: Every day | ORAL | Status: DC
  Administered 2015-04-24 – 2015-04-25 (×2): 5 mg via ORAL
  Filled 2015-04-24 (×2): qty 1

## 2015-04-24 MED ORDER — INSULIN DETEMIR 100 UNIT/ML ~~LOC~~ SOLN: 15.0000 [IU] | Freq: Every day | SUBCUTANEOUS | Status: DC

## 2015-04-24 MED ORDER — METFORMIN HCL 500 MG PO TABS
1000.0000 mg | ORAL_TABLET | Freq: Every day | ORAL | Status: DC
  Administered 2015-04-24 – 2015-04-26 (×3): 1000 mg via ORAL
  Filled 2015-04-24 (×4): qty 2

## 2015-04-24 MED ORDER — INSULIN ASPART 100 UNIT/ML ~~LOC~~ SOLN
0.0000 [IU] | Freq: Three times a day (TID) | SUBCUTANEOUS | Status: DC
  Administered 2015-04-24: 15 [IU] via SUBCUTANEOUS
  Administered 2015-04-25 (×3): 8 [IU] via SUBCUTANEOUS
  Administered 2015-04-26: 5 [IU] via SUBCUTANEOUS
  Administered 2015-04-26: 3 [IU] via SUBCUTANEOUS

## 2015-04-24 MED ORDER — INSULIN DETEMIR 100 UNIT/ML ~~LOC~~ SOLN
24.0000 [IU] | Freq: Every day | SUBCUTANEOUS | Status: DC
  Filled 2015-04-24: qty 0.24

## 2015-04-24 MED ORDER — GLUCERNA SHAKE PO LIQD
237.0000 mL | Freq: Two times a day (BID) | ORAL | Status: DC
  Administered 2015-04-24 – 2015-04-26 (×4): 237 mL via ORAL

## 2015-04-24 MED ORDER — ASPIRIN EC 325 MG PO TBEC: 325.0000 mg | DELAYED_RELEASE_TABLET | Freq: Two times a day (BID) | ORAL | Status: AC

## 2015-04-24 MED ORDER — SITAGLIPTIN PHOS-METFORMIN HCL 50-1000 MG PO TABS: 1.0000 | ORAL_TABLET | Freq: Two times a day (BID) | ORAL | Status: DC

## 2015-04-24 MED ORDER — INSULIN ASPART 100 UNIT/ML ~~LOC~~ SOLN
15.0000 [IU] | Freq: Once | SUBCUTANEOUS | Status: AC
  Administered 2015-04-24: 15 [IU] via SUBCUTANEOUS

## 2015-04-24 MED ORDER — INFLUENZA VAC SPLIT QUAD 0.5 ML IM SUSY
0.5000 mL | PREFILLED_SYRINGE | INTRAMUSCULAR | Status: DC
Start: 2015-04-25 — End: 2015-04-26
  Filled 2015-04-24: qty 0.5

## 2015-04-24 MED ORDER — INSULIN ASPART 100 UNIT/ML ~~LOC~~ SOLN
3.0000 [IU] | Freq: Three times a day (TID) | SUBCUTANEOUS | Status: DC
  Administered 2015-04-24: 3 [IU] via SUBCUTANEOUS

## 2015-04-24 MED ORDER — INSULIN DETEMIR 100 UNIT/ML ~~LOC~~ SOLN
5.0000 [IU] | Freq: Once | SUBCUTANEOUS | Status: AC
Start: 2015-04-24 — End: 2015-04-24
  Administered 2015-04-24: 5 [IU] via SUBCUTANEOUS
  Filled 2015-04-24: qty 0.05

## 2015-04-24 MED ORDER — INSULIN ASPART 100 UNIT/ML ~~LOC~~ SOLN
4.0000 [IU] | Freq: Once | SUBCUTANEOUS | Status: AC
  Administered 2015-04-24: 4 [IU] via SUBCUTANEOUS

## 2015-04-24 NOTE — Progress Notes (Signed)
Initial Nutrition Assessment  DOCUMENTATION CODES:   Obesity unspecified  INTERVENTION:  Provide Glucerna Shake po BID, each supplement provides 220 kcal and 10 grams of protein.  Encourage adequate PO intake.   NUTRITION DIAGNOSIS:   Inadequate oral intake related to poor appetite as evidenced by meal completion < 25%.  GOAL:   Patient will meet greater than or equal to 90% of their needs  MONITOR:   PO intake, Supplement acceptance, Weight trends, Labs, I & O's  REASON FOR ASSESSMENT:   Consult Hip fracture protocol  ASSESSMENT:   64 y.o. female who suffered a mechanical fall at home. Landed on R hip. Has R leg deformity and inability to bear weight on R hip after fall. Severe pain in R hip, worse with movement, better when not moving.  Procedure (10/23): INTRAMEDULLARY (IM) NAIL FEMORAL (Right)  Pt reports having a lack of appetite. No meal completion recorded, however pt reports </= 25% po this AM. She reports she has been mostly consuming ice chips as her mouth has been feeling dry. PTA pt reports eating well with no other difficulties. Weight has been stable. Pt is agreeable to Glucerna Shake to aid in caloric and protein needs. Pt does reports she will try to eat more at lunch time. Pt encouraged to eat her food at meals.   Pt with no observed significant fat or muscle mass loss.   CBG's elevated 312-428 mg/dL.  Diet Order:  Diet Carb Modified Fluid consistency:: Thin; Room service appropriate?: Yes  Skin:   (Incision on R hip)  Last BM:  10/24  Height:   Ht Readings from Last 1 Encounters:  02/20/15 5\' 6"  (1.676 m)    Weight:   Wt Readings from Last 1 Encounters:  04/23/15 220 lb (99.791 kg)    Ideal Body Weight:  59 kg  BMI:  Body mass index is 35.53 kg/(m^2).  Estimated Nutritional Needs:   Kcal:  1850-2050  Protein:  100-110 grams  Fluid:  1.9 - 2.1 L/day  EDUCATION NEEDS:   No education needs identified at this time  Roslyn SmilingStephanie  Miriam Kestler, MS, RD, LDN Pager # [phone] After hours/ weekend pager # [phone]

## 2015-04-24 NOTE — Anesthesia Postprocedure Evaluation (Signed)
  Anesthesia Post-op Note  Patient: Marissa Leach  Procedure(s) Performed: Procedure(s): INTRAMEDULLARY (IM) NAIL FEMORAL (Right)  Patient Location: PACU  Anesthesia Type:General  Level of Consciousness: awake and alert   Airway and Oxygen Therapy: Patient Spontanous Breathing  Post-op Pain: mild  Post-op Assessment: Post-op Vital signs reviewed              Post-op Vital Signs: Reviewed  Last Vitals:  Filed Vitals:   04/23/15 2328  BP: 150/66  Pulse: 114  Temp: 36.8 C  Resp: 17    Complications: No apparent anesthesia complications

## 2015-04-24 NOTE — Progress Notes (Signed)
Short acting insulin for HS ordered.

## 2015-04-24 NOTE — Evaluation (Signed)
Physical Therapy Evaluation Patient Details Name: Marissa Leach MRN: [id number] DOB: 08/21/1950 Today's Date: 04/24/2015   History of Present Illness  Pt admitted after a fall in a neighbor's yard with right hip fx s/p IM nail. Pt  with Right RCR in August. PMHx DM  Clinical Impression  Pt pleasant but apprehensive about moving today. Pt able to perform limited gait with RW and transfer with min-mod assist. Pt with decreased strength, gait and function who will benefit from acute therapy to maximize mobility, transfers and gait to decrease burden of care and return pt to PLOF. Pt educated for plan and HEp with encouragement to continue HEP and mobility with nursing.     Follow Up Recommendations Home health PT    Equipment Recommendations  Rolling walker with 5" wheels;3in1 (PT)    Recommendations for Other Services       Precautions / Restrictions Precautions Precautions: Fall Restrictions Weight Bearing Restrictions: Yes RLE Weight Bearing: Weight bearing as tolerated      Mobility  Bed Mobility Overal bed mobility: Needs Assistance Bed Mobility: Supine to Sit     Supine to sit: Mod assist;HOB elevated     General bed mobility comments: assist with HOB 30 degrees, use of pad to pivot to EOB, assist to elevate trunk and mod sequential cues throughout  Transfers Overall transfer level: Needs assistance   Transfers: Sit to/from Stand Sit to Stand: Min assist         General transfer comment: cues for hand placement, safety and RLE positioning for sitting  Ambulation/Gait Ambulation/Gait assistance: Min assist Ambulation Distance (Feet): 15 Feet Assistive device: Rolling walker (2 wheeled) Gait Pattern/deviations: Step-to pattern   Gait velocity interpretation: Below normal speed for age/gender General Gait Details: cues for posture, position in RW, sequence  Stairs            Wheelchair Mobility    Modified Rankin (Stroke Patients Only)        Balance                                             Pertinent Vitals/Pain Pain Assessment: No/denies pain  sats 95% on RA HR 105 No pain at rest, discomfort with movement of right hip- unrated    Home Living Family/patient expects to be discharged to:: Private residence Living Arrangements: Spouse/significant other Available Help at Discharge: Family;Available 24 hours/day Type of Home: House Home Access: Stairs to enter Entrance Stairs-Rails: None Entrance Stairs-Number of Steps: 3 Home Layout: One level Home Equipment: None      Prior Function Level of Independence: Independent               Hand Dominance        Extremity/Trunk Assessment   Upper Extremity Assessment: RUE deficits/detail RUE Deficits / Details: decreased strength secondary to recent RCR         Lower Extremity Assessment: RLE deficits/detail;LLE deficits/detail RLE Deficits / Details: decreased strength and ROM as expected post op. Pt with report of knee that is not in line and does not fully extend LLE Deficits / Details: WFL  Cervical / Trunk Assessment: Normal  Communication   Communication: No difficulties  Cognition Arousal/Alertness: Awake/alert Behavior During Therapy: WFL for tasks assessed/performed Overall Cognitive Status: Within Functional Limits for tasks assessed  General Comments      Exercises General Exercises - Lower Extremity Long Arc Quad: AROM;Seated;Right;5 reps Hip ABduction/ADduction: AAROM;Seated;Right;5 reps Hip Flexion/Marching: AAROM;Seated;Right;5 reps      Assessment/Plan    PT Assessment Patient needs continued PT services  PT Diagnosis Difficulty walking   PT Problem List Decreased strength;Decreased range of motion;Decreased activity tolerance;Decreased mobility;Decreased balance;Decreased knowledge of use of DME  PT Treatment Interventions Gait training;DME instruction;Stair  training;Functional mobility training;Therapeutic activities;Therapeutic exercise;Patient/family education   PT Goals (Current goals can be found in the Care Plan section) Acute Rehab PT Goals Patient Stated Goal: return home and to baking PT Goal Formulation: With patient/family Time For Goal Achievement: 05/08/15 Potential to Achieve Goals: Good    Frequency Min 6X/week   Barriers to discharge        Co-evaluation               End of Session Equipment Utilized During Treatment: Gait belt Activity Tolerance: Patient tolerated treatment well Patient left: in chair;with call bell/phone within reach;with chair alarm set;with family/visitor present Nurse Communication: Mobility status;Precautions;Weight bearing status         Time: 0900-0923 PT Time Calculation (min) (ACUTE ONLY): 23 min   Charges:   PT Evaluation $Initial PT Evaluation Tier I: 1 Procedure PT Treatments $Gait Training: 8-22 mins   PT G CodesDelorse Lek 04/24/2015, 9:33 AM Delaney Meigs, PT [phone]

## 2015-04-24 NOTE — Progress Notes (Signed)
[facility] PROGRESS NOTE  Clearnce SorrelDonna A Ewing UJW:[id number] DOB: 02/04/1951 DOA: 04/23/2015 PCP: JEFFERY,CHELLE, PA-C  64 year old female with history of uncontrolled diabetes mellitus (last A1c >11), and hyperlipidemia presented to the ED after sustaining a mechanical fall and landed on her right hip. She sustained a closed right hip fracture and was taken to OR for right hip IM nailing. Recent having persistent hyperglycemia postop.   Assessment/Plan: Right hip fracture Status post IM nailing on 10/23. Tolerated well. Pain control with Vicodin. DVT prophylaxis with aspirin 325 mg twice daily. Patient participating with physical therapy already. Recommend home health PT. Discontinue Foley.    uncontrolled type 2 diabetes mellitus Fingersticks persistently in 400s. Getting aggressive coverage. I have increased her Lantus dose to 30 units at bedtime with a meal coverage of 6 units 3 times a day. Continue resistant sliding scale. Check A1c. Patient reports her blood glucose to have persistently elevated since her recent right arm surgery. Resumed glipizide and janumet. -If fingersticks persistently remain elevated will place her on insulin drip.   Code Status: full code Family Communication: family at bedside  Disposition Plan: patient will be followed by family practice residency team tomorrow. ([facility])  Home possibly on 10/26  Consultants:  ortho ( chandler)    Procedures:  Rt hip IM nail on 10/23   Antibiotics:  none   HPI/Subjective  Seen and examined. Complains of painover surgical site. Blood glucose elevated   Objective: Filed Vitals:   04/24/15 0928  BP:   Pulse: 105  Temp:   Resp:     Intake/Output Summary (Last 24 hours) at 04/24/15 0934 Last data filed at 04/24/15 0500  Gross per 24 hour  Intake   1200 ml  Output   1225 ml  Net    -25 ml   Filed Weights   04/23/15 1706  Weight: 99.791 kg (220 lb)     Exam:   General:  Middle aged female in no acute distress  HEENT: No pallor, moist oral mucosa  Chest: Clear to auscultation bilaterally  CVS: Normal S1 and S2, no murmurs rub or gallop  GI: Soft, nondistended, nontender, bowel sounds present  Musculoskeletal: Clean dressing over her right hip, Foley catheter+  CNS: Alert and oriented    Data Reviewed: Basic Metabolic Panel:  Recent Labs Lab 04/23/15 1904 04/24/15 0347  NA 134* 135  K 4.5 4.8  CL 101 99*  CO2 21* 26  GLUCOSE 345* 426*  BUN 20 19  CREATININE 1.01* 1.18*  CALCIUM 9.4 8.8*   Liver Function Tests: No results for input(s): AST, ALT, ALKPHOS, BILITOT, PROT, ALBUMIN in the last 168 hours. No results for input(s): LIPASE, AMYLASE in the last 168 hours. No results for input(s): AMMONIA in the last 168 hours. CBC:  Recent Labs Lab 04/23/15 1904 04/24/15 0347  WBC 12.8* 11.1*  NEUTROABS 11.7*  --   HGB 12.8 10.9*  HCT 39.1 33.8*  MCV 86.7 87.3  PLT 309 369   Cardiac Enzymes: No results for input(s): CKTOTAL, CKMB, CKMBINDEX, TROPONINI in the last 168 hours. BNP (last 3 results) No results for input(s): BNP in the last 8760 hours.  ProBNP (last 3 results) No results for input(s): PROBNP in the last 8760 hours.  CBG:  Recent Labs Lab 04/23/15 2007 04/23/15 2232 04/23/15 2339 04/24/15 0633  GLUCAP 312* 370* 386* 428*    No results found for this or any previous visit (from the past 240 hour(s)).   Studies:  Dg Chest 1 View  04/23/2015  CLINICAL DATA:  Preop for right hip fracture. EXAM: CHEST 1 VIEW COMPARISON:  None. FINDINGS: The heart size and mediastinal contours are within normal limits. Both lungs are clear. The visualized skeletal structures are unremarkable. IMPRESSION: No acute cardiopulmonary abnormality seen. Electronically Signed   By: Lupita Raider, M.D.   On: 04/23/2015 18:45   Dg Hip Operative Unilat With Pelvis Right  04/23/2015  CLINICAL DATA:  64 year old  female status post surgical surgical repair for comminuted intertrochanteric hip fracture. EXAM: RIGHT FEMUR PORTABLE 1 VIEW; OPERATIVE RIGHT HIP WITH PELVIS COMPARISON:  04/23/2015. FINDINGS: Multiple intraoperative fluoroscopic views demonstrate interval placement of a intra medullary rod and gamma nail fixation device traversing the previously noted comminuted intertrochanteric hip fracture. Restoration of near anatomic alignment has been achieved, with some mild persistent medial displacement of the lesser trochanteric fragment. Postoperative films confirm the above findings and demonstrates some gas in the overlying soft tissues and skin staples lateral to the hip joint. IMPRESSION: 1. Intraoperative documentation and postoperative failed demonstrating interval ORIF for comminuted intertrochanteric hip fracture with restoration of near anatomic alignment, as above. Electronically Signed   By: Trudie Reed M.D.   On: 04/23/2015 23:13   Dg Hip Unilat  With Pelvis 2-3 Views Right  04/23/2015  CLINICAL DATA:  64 year old female with right-sided hip pain after falling over a pole in the ground at a birthday party this afternoon. Unable to move leg. EXAM: DG HIP (WITH OR WITHOUT PELVIS) 2-3V RIGHT COMPARISON:  No priors. FINDINGS: Comminuted intertrochanteric fracture of the right proximal femur with some distraction of fracture fragments and mild varus rotation (less than 10 degrees) of the right hip. Lesser trochanter fragment is approximately 12 mm medially displaced. Right femoral head remains properly located. Bony pelvis appears intact, as does the visualized portions of the left proximal femur. IMPRESSION: 1. Comminuted mildly displaced and angulated intertrochanteric fracture of the right hip, as above. Electronically Signed   By: Trudie Reed M.D.   On: 04/23/2015 18:45   Dg Femur Port, 1v Right  04/23/2015  CLINICAL DATA:  64 year old female status post surgical surgical repair for  comminuted intertrochanteric hip fracture. EXAM: RIGHT FEMUR PORTABLE 1 VIEW; OPERATIVE RIGHT HIP WITH PELVIS COMPARISON:  04/23/2015. FINDINGS: Multiple intraoperative fluoroscopic views demonstrate interval placement of a intra medullary rod and gamma nail fixation device traversing the previously noted comminuted intertrochanteric hip fracture. Restoration of near anatomic alignment has been achieved, with some mild persistent medial displacement of the lesser trochanteric fragment. Postoperative films confirm the above findings and demonstrates some gas in the overlying soft tissues and skin staples lateral to the hip joint. IMPRESSION: 1. Intraoperative documentation and postoperative failed demonstrating interval ORIF for comminuted intertrochanteric hip fracture with restoration of near anatomic alignment, as above. Electronically Signed   By: Trudie Reed M.D.   On: 04/23/2015 23:13    Scheduled Meds: . aspirin EC  325 mg Oral BID  . glipiZIDE  10 mg Oral BID AC  . lisinopril  20 mg Oral Daily   And  . hydrochlorothiazide  12.5 mg Oral Daily  . HYDROmorphone      . HYDROmorphone      . [START ON 04/25/2015] Influenza vac split quadrivalent PF  0.5 mL Intramuscular Tomorrow-1000  . insulin aspart      . insulin aspart  0-15 Units Subcutaneous TID WC  . insulin aspart  3 Units Subcutaneous TID WC  . insulin detemir  24 Units  Subcutaneous QHS  . ketorolac       Continuous Infusions:      Time spent: 25 minutes    Tykisha Areola  [facility] Pager [phone]. If 7PM-7AM, please contact night-coverage at [email or web address], password [facility] 04/24/2015, 9:34 AM  LOS: 1 day

## 2015-04-24 NOTE — Significant Event (Addendum)
Called with BGL of 426: patient is post op from hip fx repair and eating diet now  Giving 5 additional units levemir now (got 10 last night) and increasing nightly dose to 15 units qhs (takes 20 units QHS at home) Resuming home Glipizide 10mg  PO BID WC Increasing SSI to medium dose AC/HS from sensitive scale Q4H Giving one time dose of 15 units novolog now (got 9 units for BGL of over 300 in surgery and BGL this morning is even higher). Not yet resuming home Janumet that she takes.  Consult placed to DM coordinator for uncontrolled DM.

## 2015-04-24 NOTE — Progress Notes (Signed)
Notified provider oncall re bg of 320, no short acting insulin ordered at this time.  Will monitor and evaluate at intervals.

## 2015-04-24 NOTE — Progress Notes (Signed)
Inpatient Diabetes Program Recommendations  AACE/ADA: New Consensus Statement on Inpatient Glycemic Control (2015)  Target Ranges:  Prepandial:   less than 140 mg/dL      Peak postprandial:   less than 180 mg/dL (1-2 hours)      Critically ill patients:  140 - 180 mg/dL   Review of Glycemic Control    Diabetes history: Type 2 Outpatient Diabetes medications: Glucotrol 10mg  bid, Levemir 20 units qhs, Janumet 50/1000mg  bid Current orders for Inpatient glycemic control: Glucotrol 10mg  bid, Levemir 24 units qhs, Novolog 0-15 units tid with meals, Novolog 3 units tid with meals  Inpatient Diabetes Program Recommendations:   OD note at 7:03am states patient received 10 units of Levemir last night- [facility] in the OR notes, or on the glucose summary tab as given so elevated fasting blood sugar, likely a result of missed Levemir.   Please consider ordering an A1C to determine blood sugar control over the past 2-3 months.    Agree with current medications for diabetes including increased Levemir (24 units qhs) since she will not be getting Janumet while inpatient.   Spoke to [employer] Margaret about patient blood sugars.   Susette RacerJulie Chanika Byland, RN, BA, MHA, CDE Diabetes Coordinator Inpatient Diabetes Program  [phone] (Team Pager) [phone] [facility] Office) 04/24/2015 9:09 AM

## 2015-04-24 NOTE — Clinical Social Work Note (Signed)
CSW received referral for SNF.  Case discussed with case manager, and plan is to discharge home.  CSW to sign off please re-consult if social work needs arise.  Prapti Grussing R. Achaia Garlock, MSW, LCSWA [phone]  

## 2015-04-24 NOTE — Progress Notes (Signed)
   PATIENT ID: Marissa Leach   1 Day Post-Op Procedure(s) (LRB): INTRAMEDULLARY (IM) NAIL FEMORAL (Right)  Subjective: Doing well, comfortable this am.   Objective:  Filed Vitals:   04/24/15 0630  BP: 120/63  Pulse: 107  Temp: 99.8 F (37.7 C)  Resp: 18     R leg dressings c/d/i Distally NVI   Labs:   Recent Labs  04/23/15 1904 04/24/15 0347  HGB 12.8 10.9*   Recent Labs  04/23/15 1904 04/24/15 0347  WBC 12.8* 11.1*  RBC 4.51 3.87  HCT 39.1 33.8*  PLT 309 369   Recent Labs  04/23/15 1904 04/24/15 0347  NA 134* 135  K 4.5 4.8  CL 101 99*  CO2 21* 26  BUN 20 19  CREATININE 1.01* 1.18*  GLUCOSE 345* 426*  CALCIUM 9.4 8.8*    Assessment and Plan: 1 day s/p right IM femoral nail  WBAT Up with OT/PT today Goal of po percocet for pain control Uncontrolled DM- followed by medicine VTE proph: ASA 325mg  BID, SCDs

## 2015-04-24 NOTE — Care Management Note (Signed)
Case Management Note  Patient Details  Name: Clearnce SorrelDonna A Vidaurri MRN: [id number] Date of Birth: 12/06/1950  Subjective/Objective:   64 yr old female admitted with a right hip fracture, s/p right hip IM Nailing.                Action/Plan:  Case manager spoke with patient concerning home health and DME needs, choice was offered. [facility]. Patient will have family support at discharge.   Expected Discharge Date:  04/27/15               Expected Discharge Plan:  Home w Home Health Services  In-House Referral:     Discharge planning Services  CM Consult  Post Acute Care Choice:  Durable Medical Equipment, Home Health Choice offered to:     DME Arranged:  3-N-1, Walker rolling DME Agency:  [facility]  [facility]  [facility]  Status of Service:  Completed, signed off  Medicare Important Message Given:    Date Medicare IM Given:    Medicare IM give by:    Date Additional Medicare IM Given:    Additional Medicare Important Message give by:     If discussed at Long Length of Stay Meetings, dates discussed:    Additional Comments:  Durenda GuthrieBrady, Marlaya Turck Naomi, RN 04/24/2015, 2:46 PM

## 2015-04-24 NOTE — Evaluation (Signed)
Occupational Therapy Evaluation Patient Details Name: Marissa Leach MRN: [id number] DOB: 1951/04/26 Today's Date: 04/24/2015    History of Present Illness Pt admitted after a fall in a neighbor's yard with right hip fx s/p IM nail. Pt  with Right RCR in August. PMHx DM   Clinical Impression   Pt reports she was independent with ADLs and mobility PTA. Educated pt and husband on compensatory strategies for LB ADLs, safety with RW, need for close supervision during ADLs and functional mobility; pt verbalized understanding. Educated on AE, pt declined and reports husband will help as needed. Pt plan to d/c home with 24/7 supervision from her husband. Pt would benefit from continued OT services in order to maximize independence with ADLs and functional transfers required for safe d/c home.     Follow Up Recommendations  No OT follow up;Supervision - Intermittent    Equipment Recommendations  3 in 1 bedside comode    Recommendations for Other Services       Precautions / Restrictions Precautions Precautions: Fall Restrictions Weight Bearing Restrictions: Yes RLE Weight Bearing: Weight bearing as tolerated      Mobility Bed Mobility Overal bed mobility: Needs Assistance Bed Mobility: Supine to Sit     Supine to sit: Mod assist;HOB elevated     General bed mobility comments: pt sitting EOB, returned to EOB  Transfers Overall transfer level: Needs assistance Equipment used: Rolling walker (2 wheeled) Transfers: Sit to/from Stand Sit to Stand: Min guard         General transfer comment: cues for hand placement, safety and RLE positioning for sitting    Balance Overall balance assessment: Needs assistance Sitting-balance support: Single extremity supported;Feet supported Sitting balance-Leahy Scale: Good     Standing balance support: Bilateral upper extremity supported Standing balance-Leahy Scale: Poor Standing balance comment: RW for support                             ADL Overall ADL's : Needs assistance/impaired Eating/Feeding: Set up;Sitting                   Lower Body Dressing: Minimal assistance;Sit to/from stand Lower Body Dressing Details (indicate cue type and reason): Min A to thread legs Toilet Transfer: Min guard;Ambulation;BSC;RW (BSC over toilet)           Functional mobility during ADLs: Min guard;Rolling walker General ADL Comments: Husband present during OT eval. Educated pt on compensatory strategies for LB ADLs, safety with RW, need for close supervision during ADLs and functional mobility; pt verbalized understanding. Pt reports that she typically uses her tub shower at home but has a walk in if she needs it; recommended walk in shower upon return home. Educated pt on AE for increased independence with LB ADLs; pt declined, spouse will assist as needed.      Vision     Perception     Praxis      Pertinent Vitals/Pain Pain Assessment: 0-10 Pain Score: 8  Pain Location: R hip Pain Descriptors / Indicators: Aching;Sore Pain Intervention(s): Limited activity within patient's tolerance;Monitored during session;Patient requesting pain meds-RN notified     Hand Dominance     Extremity/Trunk Assessment Upper Extremity Assessment Upper Extremity Assessment: RUE deficits/detail RUE Deficits / Details: decreased strength secondary to recent RCR   Lower Extremity Assessment Lower Extremity Assessment: Defer to PT evaluation RLE Deficits / Details: decreased strength and ROM as expected post op. Pt with report of  knee that is not in line and does not fully extend LLE Deficits / Details: WFL   Cervical / Trunk Assessment Cervical / Trunk Assessment: Normal   Communication Communication Communication: No difficulties   Cognition Arousal/Alertness: Awake/alert Behavior During Therapy: WFL for tasks assessed/performed Overall Cognitive Status: Within Functional Limits for tasks assessed                      General Comments       Exercises       Shoulder Instructions      Home Living Family/patient expects to be discharged to:: Private residence Living Arrangements: Spouse/significant other Available Help at Discharge: Family;Available 24 hours/day Type of Home: House Home Access: Stairs to enter Entergy CorporationEntrance Stairs-Number of Steps: 3 Entrance Stairs-Rails: None Home Layout: One level     Bathroom Shower/Tub: Tub/shower unit;Walk-in shower   Bathroom Toilet: Standard Bathroom Accessibility: Yes How Accessible: Accessible via walker Home Equipment: None          Prior Functioning/Environment Level of Independence: Independent             OT Diagnosis: Generalized weakness;Acute pain   OT Problem List: Decreased activity tolerance;Impaired balance (sitting and/or standing);Decreased safety awareness;Decreased knowledge of use of DME or AE;Decreased knowledge of precautions;Pain   OT Treatment/Interventions: Self-care/ADL training;DME and/or AE instruction;Therapeutic activities    OT Goals(Current goals can be found in the care plan section) Acute Rehab OT Goals Patient Stated Goal: to go home OT Goal Formulation: With patient Time For Goal Achievement: 05/08/15 Potential to Achieve Goals: Good ADL Goals Pt Will Perform Grooming: standing;with supervision Pt Will Perform Lower Body Bathing: with supervision;sit to/from stand Pt Will Perform Lower Body Dressing: with supervision;sit to/from stand Pt Will Transfer to Toilet: with supervision;ambulating;bedside commode (over toilet) Pt Will Perform Tub/Shower Transfer: with supervision;ambulating;3 in 1;rolling walker (tub or shower)  OT Frequency: Min 2X/week   Barriers to D/C:            Co-evaluation              End of Session Equipment Utilized During Treatment: Rolling walker;Gait belt Nurse Communication: Mobility status;Other (comment) (remove foley?)  Activity Tolerance: Patient  tolerated treatment well Patient left: in bed;with call bell/phone within reach;with nursing/sitter in room;with family/visitor present (sitting EOB)   Time: 1610-96041212-1225 OT Time Calculation (min): 13 min Charges:  OT General Charges $OT Visit: 1 Procedure OT Evaluation $Initial OT Evaluation Tier I: 1 Procedure G-Codes:     Gaye AlkenBailey A Mylez Venable M.S., OTR/L Pager: [phone]  04/24/2015, 12:34 PM

## 2015-04-24 NOTE — Progress Notes (Signed)
OT Cancellation Note  Patient Details Name: Marissa SorrelDonna A Leach MRN: [id number] DOB: 09/25/1950   Cancelled Treatment:    Reason Eval/Treat Not Completed: Patient declined, no reason specified. Pt reports that she just got finished working with PT and would like some time to rest, declined OT eval at this time. Will check back as time allows.    Gaye AlkenBailey A Austine Kelsay M.S., OTR/L Pager: [phone]  04/24/2015, 9:47 AM

## 2015-04-25 DIAGNOSIS — S72144D Nondisplaced intertrochanteric fracture of right femur, subsequent encounter for closed fracture with routine healing: Secondary | ICD-10-CM

## 2015-04-25 DIAGNOSIS — E785 Hyperlipidemia, unspecified: Secondary | ICD-10-CM

## 2015-04-25 DIAGNOSIS — I10 Essential (primary) hypertension: Secondary | ICD-10-CM

## 2015-04-25 DIAGNOSIS — R739 Hyperglycemia, unspecified: Secondary | ICD-10-CM | POA: Insufficient documentation

## 2015-04-25 LAB — HEMOGLOBIN A1C
HEMOGLOBIN A1C: 12.2 % — AB (ref 4.8–5.6)
MEAN PLASMA GLUCOSE: 303 mg/dL

## 2015-04-25 LAB — CBC
HEMATOCRIT: 33.2 % — AB (ref 36.0–46.0)
Hemoglobin: 11.1 g/dL — ABNORMAL LOW (ref 12.0–15.0)
MCH: 29.2 pg (ref 26.0–34.0)
MCHC: 33.4 g/dL (ref 30.0–36.0)
MCV: 87.4 fL (ref 78.0–100.0)
PLATELETS: 318 10*3/uL (ref 150–400)
RBC: 3.8 MIL/uL — ABNORMAL LOW (ref 3.87–5.11)
RDW: 13.7 % (ref 11.5–15.5)
WBC: 8.9 10*3/uL (ref 4.0–10.5)

## 2015-04-25 LAB — GLUCOSE, CAPILLARY
GLUCOSE-CAPILLARY: 275 mg/dL — AB (ref 65–99)
Glucose-Capillary: 261 mg/dL — ABNORMAL HIGH (ref 65–99)
Glucose-Capillary: 281 mg/dL — ABNORMAL HIGH (ref 65–99)
Glucose-Capillary: 199 mg/dL — ABNORMAL HIGH (ref 65–99)

## 2015-04-25 LAB — BASIC METABOLIC PANEL
ANION GAP: 11 (ref 5–15)
BUN: 18 mg/dL (ref 6–20)
CALCIUM: 9.2 mg/dL (ref 8.9–10.3)
CHLORIDE: 97 mmol/L — AB (ref 101–111)
CO2: 26 mmol/L (ref 22–32)
CREATININE: 1 mg/dL (ref 0.44–1.00)
GFR calc Af Amer: >60 mL/min (ref >60)
GFR calc non Af Amer: 59 mL/min — ABNORMAL LOW (ref >60)
Glucose, Bld: 334 mg/dL — ABNORMAL HIGH (ref 65–99)
POTASSIUM: 4.6 mmol/L (ref 3.5–5.1)
Sodium: 134 mmol/L — ABNORMAL LOW (ref 135–145)

## 2015-04-25 MED ORDER — CALCIUM CARBONATE-VITAMIN D 500-200 MG-UNIT PO TABS
2.0000 | ORAL_TABLET | Freq: Every day | ORAL | Status: DC
  Administered 2015-04-26: 2 via ORAL
  Filled 2015-04-25: qty 2

## 2015-04-25 MED ORDER — HYDROCODONE-ACETAMINOPHEN 5-325 MG PO TABS
1.0000 | ORAL_TABLET | Freq: Four times a day (QID) | ORAL | Status: DC | PRN
  Administered 2015-04-25: 2 via ORAL
  Filled 2015-04-25: qty 2

## 2015-04-25 MED ORDER — HYDROCODONE-ACETAMINOPHEN 5-325 MG PO TABS: 1.0000 | ORAL_TABLET | Freq: Four times a day (QID) | ORAL | Status: DC | PRN

## 2015-04-25 MED ORDER — CALCIUM CITRATE-VITAMIN D 500-400 MG-UNIT PO CHEW
2.0000 | CHEWABLE_TABLET | Freq: Every day | ORAL | Status: DC
  Filled 2015-04-25: qty 2

## 2015-04-25 MED ORDER — LIVING WELL WITH DIABETES BOOK
Freq: Once | Status: AC
  Administered 2015-04-25: 1
  Filled 2015-04-25: qty 1

## 2015-04-25 MED ORDER — OXYCODONE HCL 5 MG PO TABS: 5.0000 mg | ORAL_TABLET | ORAL | Status: DC | PRN

## 2015-04-25 MED ORDER — OXYCODONE HCL 5 MG PO TABS
5.0000 mg | ORAL_TABLET | Freq: Four times a day (QID) | ORAL | Status: DC | PRN
  Administered 2015-04-25 – 2015-04-26 (×4): 5 mg via ORAL
  Filled 2015-04-25 (×4): qty 1

## 2015-04-25 NOTE — Progress Notes (Addendum)
Inpatient Diabetes Program Recommendations  AACE/ADA: New Consensus Statement on Inpatient Glycemic Control (2015)  Target Ranges:  Prepandial:   less than 140 mg/dL      Peak postprandial:   less than 180 mg/dL (1-2 hours)      Critically ill patients:  140 - 180 mg/dL   Review of Glycemic Control:  Results for Clearnce SorrelMCNEILL, Kaylor A (MRN [id number]) as of 04/25/2015 13:21  Ref. Range 04/24/2015 15:13 04/24/2015 16:35 04/24/2015 22:06 04/25/2015 06:18 04/25/2015 12:04  Glucose-Capillary Latest Ref Range: 65-99 mg/dL 782358 (H) 956357 (H) 213320 (H) 261 (H) 275 (H)  Results for Payette, Geroge BasemanDONNA A (MRN [id number]) as of 04/25/2015 13:21  Ref. Range 04/24/2015 03:47  Hemoglobin A1C Latest Ref Range: 4.8-5.6 % 12.2 (H)   Diabetes history: Type 2 diabetes Outpatient Diabetes medications: Glucotrol 10 mg bid, Levemir 20 units daily, Janumet 50-1000 bid Current orders for Inpatient glycemic control:  Novolog moderate tid with meals, Levemir 30 units q HS, Glucotrol 10 mg bid, Novolog 6 units tid with meals  A1C indicates poor control of diabetes prior to admit.  Will talk to patient.    1340-  Spoke with patient and husband regarding diabetes management.  Patient states that she was started on insulin at the Urgent care ([facility]), Levemir 20 units daily.  She states her A1C was 14.0% and has now dropped to 12.2%.  Likely will need titration up of Levemir based on fasting CBG's.  She also may need Novolog at discharge to keep blood sugars controlled post-surgery.  Discussed importance of glycemic control after surgery and the importance of close monitoring after d/c.  Patient admits that she has had very little education regarding diabetes.  Will order "Outpatient diabetes education" per protocol-cosign required (will need to wait at least 6 weeks after d/c).  Also discussed potential need for PCP to manage diabetes or close follow-up with MD at Urgent care regarding diabetes.  Will order "Living Well with Diabetes" for  her also.  Patient states, "I know I really need to take care of my diabetes" .   Thanks, Beryl MeagerJenny Carlous Olivares, RN, BC-ADM Inpatient Diabetes Coordinator Pager [phone] (8a-5p)

## 2015-04-25 NOTE — Progress Notes (Addendum)
Occupational Therapy Treatment Patient Details Name: Marissa Leach MRN: [id number] DOB: Oct 16, 1950 Today's Date: 04/25/2015    History of present illness Pt admitted after a fall in a neighbor's yard with right hip fx s/p IM nail. Pt  with Right RCR in August. PMHx DM   OT comments  Pt making progress toward OT goals. Focus of session on pt and family education. Educated pt on tub transfer technique with 3 in 1; pt verbalized understanding and declined to practice at this time. Took pts husband to therapy gym to educate and demonstrate how 3 in 1 fits in tub and transfer technique; husband verbalized understanding. Provided pt and husband with handout for use of 3 in 1 in tub. Pt and husband with no further questions or concerns for OT at this time. Will continue to follow acutely.    Follow Up Recommendations  No OT follow up;Supervision - Intermittent    Equipment Recommendations  3 in 1 bedside comode    Recommendations for Other Services      Precautions / Restrictions Precautions Precautions: Fall Restrictions Weight Bearing Restrictions: Yes RLE Weight Bearing: Weight bearing as tolerated       Mobility Bed Mobility   Transfers     Balance                                   ADL Overall ADL's : Needs assistance/impaired                                 Tub/ Shower Transfer: Min guard;Ambulation;3 in 1;Rolling walker;Tub transfer     General ADL Comments: Educated pt on tub transfer technique using 3 in 1; pt declined to practice tub transfer at this time. Took pts husband down to therapy gym to educate and demonstrate how 3 in 1 fits in tub and transfer technique; husband verbalized understanding. Provided pt and husband with handout for 3 in 1 as a shower seat in tub.      Vision                     Perception     Praxis      Cognition   Behavior During Therapy: WFL for tasks assessed/performed Overall Cognitive  Status: Within Functional Limits for tasks assessed                       Extremity/Trunk Assessment               Exercises    Shoulder Instructions       General Comments      Pertinent Vitals/ Pain       Pain Assessment: Faces Pain Score: 4  Faces Pain Scale: Hurts a little bit Pain Location: R hip Pain Descriptors / Indicators: Aching;Sore Pain Intervention(s): Limited activity within patient's tolerance;Repositioned  Home Living                                          Prior Functioning/Environment              Frequency Min 2X/week     Progress Toward Goals  OT Goals(current goals can now be found in the care plan section)  Progress towards OT  goals: Progressing toward goals  Acute Rehab OT Goals Patient Stated Goal: to go home OT Goal Formulation: With patient  Plan Discharge plan remains appropriate    Co-evaluation                 End of Session     Activity Tolerance Patient tolerated treatment well   Patient Left in bed;with call bell/phone within reach;with family/visitor present   Nurse Communication          Time: 1610-96041110-1124 OT Time Calculation (min): 14 min  Charges: OT General Charges $OT Visit: 1 Procedure OT Treatments $Self Care/Home Management : 8-22 mins  Gaye AlkenBailey A Strummer Canipe M.S., OTR/L Pager: [phone]  04/25/2015, 11:28 AM

## 2015-04-25 NOTE — Progress Notes (Signed)
Physical Therapy Treatment Patient Details Name: Marissa SorrelDonna A Maden MRN: [id number] DOB: 05/24/1951 Today's Date: 04/25/2015    History of Present Illness Pt admitted after a fall in a neighbor's yard with right hip fx s/p IM nail. Pt  with Right RCR in August. PMHx DM    PT Comments    Pt progressing with gait and able to walk in hallway and perform stairs today with assist of spouse. Pt educated for HEP, gait progression and activity. Pt fearful of increased activity and continues to benefit from reassurance and encouragement with activity limited by right knee pain more than hip. Will continue to follow.   Follow Up Recommendations  Home health PT     Equipment Recommendations  Rolling walker with 5" wheels;3in1 (PT)    Recommendations for Other Services       Precautions / Restrictions Precautions Precautions: Fall Restrictions Weight Bearing Restrictions: Yes RLE Weight Bearing: Weight bearing as tolerated    Mobility  Bed Mobility Overal bed mobility: Needs Assistance Bed Mobility: Sit to Supine       Sit to supine: Min assist   General bed mobility comments: assist to raise RLE to surface and cues for sequence  Transfers Overall transfer level: Needs assistance   Transfers: Sit to/from Stand Sit to Stand: Min guard         General transfer comment: cues for hand placement, safety and RLE positioning for sitting from recliner and BSC  Ambulation/Gait Ambulation/Gait assistance: Min guard Ambulation Distance (Feet): 80 Feet Assistive device: Rolling walker (2 wheeled) Gait Pattern/deviations: Step-to pattern;Trunk flexed   Gait velocity interpretation: Below normal speed for age/gender General Gait Details: cues for posture, position in RW, sequence   Stairs Stairs: Yes Stairs assistance: Min assist Stair Management: Backwards;With walker Number of Stairs: 2 General stair comments: with spouse assist to stabilize RW  with cues for  sequence  Wheelchair Mobility    Modified Rankin (Stroke Patients Only)       Balance                                    Cognition Arousal/Alertness: Awake/alert Behavior During Therapy: WFL for tasks assessed/performed Overall Cognitive Status: Within Functional Limits for tasks assessed                      Exercises General Exercises - Lower Extremity Heel Slides: AAROM;Right;10 reps;Supine Hip ABduction/ADduction: AAROM;Right;10 reps;Supine    General Comments        Pertinent Vitals/Pain Pain Score: 4  Pain Location: right hip and knee Pain Descriptors / Indicators: Aching;Sore Pain Intervention(s): Limited activity within patient's tolerance;Repositioned    Home Living                      Prior Function            PT Goals (current goals can now be found in the care plan section) Progress towards PT goals: Progressing toward goals    Frequency       PT Plan Current plan remains appropriate    Co-evaluation             End of Session   Activity Tolerance: Patient tolerated treatment well Patient left: in bed;with call bell/phone within reach;with family/visitor present     Time: 1191-47820906-0932 PT Time Calculation (min) (ACUTE ONLY): 26 min  Charges:  $Gait Training: 8-22 mins $Therapeutic  Activity: 8-22 mins                    G Codes:      Delorse Lek 10-May-2015, 9:36 AM Delaney Meigs, PT [phone]

## 2015-04-25 NOTE — Progress Notes (Signed)
   PATIENT ID: Marissa Leach   2 Days Post-Op Procedure(s) (LRB): INTRAMEDULLARY (IM) NAIL FEMORAL (Right)  Subjective: Doing well. Reports some discomfort in knee, known OA. Walked and did stairs with PT today and tolerated that well.   Objective:  Filed Vitals:   04/25/15 0500  BP: 145/76  Pulse: 105  Temp: 99.3 F (37.4 C)  Resp: 20     R hip with dressing c/d/i Wiggles toes, distally NVI  Labs:   Recent Labs  04/23/15 1904 04/24/15 0347 04/25/15 0627  HGB 12.8 10.9* 11.1*   Recent Labs  04/24/15 0347 04/25/15 0627  WBC 11.1* 8.9  RBC 3.87 3.80*  HCT 33.8* 33.2*  PLT 369 318   Recent Labs  04/24/15 0347 04/25/15 0852  NA 135 134*  K 4.8 4.6  CL 99* 97*  CO2 26 26  BUN 19 18  CREATININE 1.18* 1.00  GLUCOSE 426* 334*  CALCIUM 8.8* 9.2    Assessment and Plan: 2 days s/p right IM femoral nail  WBAT Up with OT/PT today Goal of po percocet for pain control, script in chart D/c home likely tomorrow, per primary team  VTE proph: ASA 325mg  BID, SCDs

## 2015-04-25 NOTE — Progress Notes (Signed)
Family Medicine Teaching Service Daily Progress Note Intern Pager: [phone]  Patient name: Marissa SorrelDonna A Mangano Medical record number: [id number] Date of birth: 01/13/1951 Age: 64 y.o. Gender: female  Primary Care Provider: JEFFERY,CHELLE, PA-C Consultants: Orthopedics  Code Status: Full   Pt Overview and Major Events to Date:   Assessment and Plan:  #Right Closed Intertrochanter Femoral Fracture: Patient admitted for fracture following a fall.  Hip X-RAY showing Intraoperative documentation and postoperative failed demonstrating interval ORIF for comminuted intertrochanteric hip fracture with restoration of near anatomic alignment.  - Continue ASA 325 mg BID and SCDs - OT/PT - wants home health and walker  - currently on Norco 1 tablet, every 6 hours  -  D/c home likely tomorrow per ortho  #T2DM:. HA1C 12.2 Home Insulin Regiment includes Levemir 20 units, Glipizide, Janumet,  - Levemir 30 units, Novolog 6 units TID with meals, SSI moderate - Will stop glipizide - Will continue Janumet   - Will discuss diabetes regiment changes tomorrow morning   #HTN: BP 145/76  - Continue home HCTZ- Lisinopril   #HLD, allergies Crestor and Lipitor  - Fenofibrate   FEN/GI: Diet Carb modified  PPx: SCDs and Aspirin 325 mg BID   Disposition: Home health with PT/ Skilled PT at discharge   Subjective:  Patient doing well. She states that she has 10/10 only when moving around. No pain when sitting in bed or chair   Objective: Temp:  [99.3 F (37.4 C)-99.5 F (37.5 C)] 99.3 F (37.4 C) (10/25 0500) Pulse Rate:  [105-107] 105 (10/25 0500) Resp:  [20] 20 (10/25 0500) BP: (122-145)/(73-76) 145/76 mmHg (10/25 0500) SpO2:  [94 %-95 %] 94 % (10/25 0500) Physical Exam: General: NAD, sitting in a chair  Cardiovascular: RRR, no murmurs, rubs, gallops  Respiratory: CTAB, no wheezes, rhonchi Abdomen: BS+, no ttp, no rebound  Extremities: Right hip sites without infection.  Laboratory:  Recent  Labs Lab 04/23/15 1904 04/24/15 0347 04/25/15 0627  WBC 12.8* 11.1* 8.9  HGB 12.8 10.9* 11.1*  HCT 39.1 33.8* 33.2*  PLT 309 369 318    Recent Labs Lab 04/23/15 1904 04/24/15 0347  NA 134* 135  K 4.5 4.8  CL 101 99*  CO2 21* 26  BUN 20 19  CREATININE 1.01* 1.18*  CALCIUM 9.4 8.8*  GLUCOSE 345* 426*     Imaging/Diagnostic Tests:  Dg Chest 1 View  04/23/2015  CLINICAL DATA:  Preop for right hip fracture. EXAM: CHEST 1 VIEW COMPARISON:  None. FINDINGS: The heart size and mediastinal contours are within normal limits. Both lungs are clear. The visualized skeletal structures are unremarkable. IMPRESSION: No acute cardiopulmonary abnormality seen. Electronically Signed   By: Lupita RaiderJames  Green Jr, M.D.   On: 04/23/2015 18:45   Dg Hip Operative Unilat With Pelvis Right  04/23/2015  CLINICAL DATA:  64 year old female status post surgical surgical repair for comminuted intertrochanteric hip fracture. EXAM: RIGHT FEMUR PORTABLE 1 VIEW; OPERATIVE RIGHT HIP WITH PELVIS COMPARISON:  04/23/2015. FINDINGS: Multiple intraoperative fluoroscopic views demonstrate interval placement of a intra medullary rod and gamma nail fixation device traversing the previously noted comminuted intertrochanteric hip fracture. Restoration of near anatomic alignment has been achieved, with some mild persistent medial displacement of the lesser trochanteric fragment. Postoperative films confirm the above findings and demonstrates some gas in the overlying soft tissues and skin staples lateral to the hip joint. IMPRESSION: 1. Intraoperative documentation and postoperative failed demonstrating interval ORIF for comminuted intertrochanteric hip fracture with restoration of near anatomic alignment, as above.  Electronically Signed   By: Trudie Reed M.D.   On: 04/23/2015 23:13   Dg Hip Unilat  With Pelvis 2-3 Views Right  04/23/2015  CLINICAL DATA:  64 year old female with right-sided hip pain after falling over a pole in  the ground at a birthday party this afternoon. Unable to move leg. EXAM: DG HIP (WITH OR WITHOUT PELVIS) 2-3V RIGHT COMPARISON:  No priors. FINDINGS: Comminuted intertrochanteric fracture of the right proximal femur with some distraction of fracture fragments and mild varus rotation (less than 10 degrees) of the right hip. Lesser trochanter fragment is approximately 12 mm medially displaced. Right femoral head remains properly located. Bony pelvis appears intact, as does the visualized portions of the left proximal femur. IMPRESSION: 1. Comminuted mildly displaced and angulated intertrochanteric fracture of the right hip, as above. Electronically Signed   By: Trudie Reed M.D.   On: 04/23/2015 18:45   Dg Femur Port, 1v Right  04/23/2015  CLINICAL DATA:  64 year old female status post surgical surgical repair for comminuted intertrochanteric hip fracture. EXAM: RIGHT FEMUR PORTABLE 1 VIEW; OPERATIVE RIGHT HIP WITH PELVIS COMPARISON:  04/23/2015. FINDINGS: Multiple intraoperative fluoroscopic views demonstrate interval placement of a intra medullary rod and gamma nail fixation device traversing the previously noted comminuted intertrochanteric hip fracture. Restoration of near anatomic alignment has been achieved, with some mild persistent medial displacement of the lesser trochanteric fragment. Postoperative films confirm the above findings and demonstrates some gas in the overlying soft tissues and skin staples lateral to the hip joint. IMPRESSION: 1. Intraoperative documentation and postoperative failed demonstrating interval ORIF for comminuted intertrochanteric hip fracture with restoration of near anatomic alignment, as above. Electronically Signed   By: Trudie Reed M.D.   On: 04/23/2015 23:13    Eual Lindstrom Mayra Reel, MD 04/25/2015, 8:13 AM PGY-1, [facility] FPTS Intern pager: [phone], text pages welcome

## 2015-04-26 DIAGNOSIS — S72141D Displaced intertrochanteric fracture of right femur, subsequent encounter for closed fracture with routine healing: Secondary | ICD-10-CM

## 2015-04-26 LAB — HEMOGLOBIN A1C
Hgb A1c MFr Bld: 11.9 % — ABNORMAL HIGH (ref 4.8–5.6)
Mean Plasma Glucose: 295 mg/dL

## 2015-04-26 LAB — GLUCOSE, CAPILLARY
Glucose-Capillary: 206 mg/dL — ABNORMAL HIGH (ref 65–99)
Glucose-Capillary: 199 mg/dL — ABNORMAL HIGH (ref 65–99)

## 2015-04-26 MED ORDER — INFLUENZA VAC SPLIT QUAD 0.5 ML IM SUSY
0.5000 mL | PREFILLED_SYRINGE | INTRAMUSCULAR | Status: AC
  Administered 2015-04-26: 0.5 mL via INTRAMUSCULAR
  Filled 2015-04-26: qty 0.5

## 2015-04-26 MED ORDER — POLYETHYLENE GLYCOL 3350 17 GM/SCOOP PO POWD: 17.0000 g | Freq: Once | ORAL | Status: AC

## 2015-04-26 MED ORDER — INSULIN ASPART 100 UNIT/ML ~~LOC~~ SOLN: 6.0000 [IU] | SUBCUTANEOUS | Status: AC

## 2015-04-26 MED ORDER — INFLUENZA VAC SPLIT QUAD 0.5 ML IM SUSY: 0.5000 mL | PREFILLED_SYRINGE | INTRAMUSCULAR | Status: DC

## 2015-04-26 MED ORDER — INSULIN DETEMIR 100 UNIT/ML FLEXPEN: 30.0000 [IU] | PEN_INJECTOR | Freq: Every day | SUBCUTANEOUS | Status: AC

## 2015-04-26 NOTE — Progress Notes (Signed)
Physical Therapy Treatment Patient Details Name: Marissa Leach MRN: [id number] DOB: 12/03/1950 Today's Date: 04/26/2015    History of Present Illness Pt admitted after a fall in a neighbor's yard with right hip fx s/p IM nail. Pt  with Right RCR in August. PMHx DM    PT Comments    Pt progressing well with ambulation, worked a lot today on posture with ambulation because pt mentions that ambulation has been bothering her right shoulder from RCR. Improved with practice and pt and husband educated on continuing to work on this. PT will continue to follow..   Follow Up Recommendations  Home health PT     Equipment Recommendations  Rolling walker with 5" wheels;3in1 (PT)    Recommendations for Other Services       Precautions / Restrictions Precautions Precautions: Fall Restrictions Weight Bearing Restrictions: Yes RLE Weight Bearing: Weight bearing as tolerated    Mobility  Bed Mobility Overal bed mobility: Needs Assistance Bed Mobility: Sit to Supine     Supine to sit: Min assist Sit to supine: Min assist   General bed mobility comments: min  A to RLE to get back into the bed, pt able to position self with vc's once supine  Transfers Overall transfer level: Needs assistance Equipment used: Rolling walker (2 wheeled) Transfers: Sit to/from Stand Sit to Stand: Supervision         General transfer comment: pt stood from chair and toilet without physical assist, supervision for safety. Discussed seating options in pt's home to maximize indepdnence  Ambulation/Gait Ambulation/Gait assistance: Supervision Ambulation Distance (Feet): 200 Feet Assistive device: Rolling walker (2 wheeled) Gait Pattern/deviations: Step-through pattern;Decreased weight shift to left Gait velocity: decreased Gait velocity interpretation: Below normal speed for age/gender General Gait Details: worked a lot on right scapular retraction as pt ambukating with poor posture, forward head and  protracted shoulder with tension in neck, improved with verbal and tactile cues and pt and husband educated on watching this at home. This helped her right shoulder discomfort from RCR.   Stairs            Wheelchair Mobility    Modified Rankin (Stroke Patients Only)       Balance Overall balance assessment: Needs assistance Sitting-balance support: No upper extremity supported Sitting balance-Leahy Scale: Good     Standing balance support: No upper extremity supported Standing balance-Leahy Scale: Fair Standing balance comment: pt able to let go of RW and maintain static standing, needs RW for safety with dynamic acitvity though                    Cognition Arousal/Alertness: Awake/alert Behavior During Therapy: WFL for tasks assessed/performed Overall Cognitive Status: Within Functional Limits for tasks assessed                      Exercises General Exercises - Lower Extremity Ankle Circles/Pumps: AROM;Both;10 reps;Seated Quad Sets: AROM;Both;10 reps;Seated Gluteal Sets: AROM;Both;10 reps;Seated Long Arc Quad: AROM;Right;10 reps;Seated Heel Slides: AROM;Right;10 reps;Seated Hip ABduction/ADduction: AROM;Right;10 reps;Seated Straight Leg Raises: AROM;Right;Seated;5 reps    General Comments General comments (skin integrity, edema, etc.): pt with less pain with ambulation today      Pertinent Vitals/Pain Pain Assessment: Faces Faces Pain Scale: Hurts even more Pain Location: right hip Pain Descriptors / Indicators: Aching Pain Intervention(s): Limited activity within patient's tolerance;Monitored during session    Home Living  Prior Function            PT Goals (current goals can now be found in the care plan section) Acute Rehab PT Goals Patient Stated Goal: to go home PT Goal Formulation: With patient/family Time For Goal Achievement: 05/08/15 Potential to Achieve Goals: Good Progress towards PT goals:  Progressing toward goals    Frequency  Min 6X/week    PT Plan Current plan remains appropriate    Co-evaluation             End of Session Equipment Utilized During Treatment: Gait belt Activity Tolerance: Patient tolerated treatment well Patient left: in bed;with call bell/phone within reach;with family/visitor present     Time: 0935-1001 PT Time Calculation (min) (ACUTE ONLY): 26 min  Charges:  $Gait Training: 23-37 mins                    G Codes:     Lyanne Co, PT  Acute Rehab Services  [phone]  Lyanne Co 04/26/2015, 1:46 PM

## 2015-04-26 NOTE — Discharge Planning (Signed)
IV removed. Discharge instructions and prescriptions given to patient and husband. No questions or concerns expressed at this time. Explained in detail new insulin regimen. All belongings sent home with patient's husband, including walker and bedside commode. Patient discharged home with husband and home health. Transported by volunteer services.

## 2015-04-26 NOTE — Discharge Instructions (Signed)
You were admitted to hospital for a fracture of your femur. You will need to continue taking 325 mg twice a day. You will also receive home PT and nursing. Please take miralax to ensure regular bowel movements. Please continue pain regiment with Percocet as needed.  Your home Diabetes regiment has been changed. Please follow   1. Please take Levemir 30 units daily  2. Please take Novolog 6 units with meals.  3. If blood sugars right before meal time are above 250, then can give 8 units of Novolog  4. Please stop taking Glipizide 5. Please check blood sugars in the morning, and then before each meal, follow instructions as discussed above.   Discharge Instructions after Hip Surgery   You can bear weight and ambulate as tolerated on both legs Use ice on the hip intermittently over the first 48 hours after surgery.  Pain medicine has been prescribed for you.  Use your medicine liberally over the first 48 hours, and then you can begin to taper your use. You may take Extra Strength Tylenol or Tylenol only in place of the pain pills. DO NOT take ANY nonsteroidal anti-inflammatory pain medications: Advil, Motrin, Ibuprofen, Aleve, Naproxen or Naprosyn.  Take aspirin or anticoagulant medication as prescribed for 2 weeks after surgery. Please notify if allergic or sensitivity to aspirin. You may remove your dressing after two days.  You may shower 5 days after surgery. The incisions CANNOT get wet prior to 5 days. Simply allow the water to wash over the site and then pat dry. Do not rub the incisions.    Please call [phone] during normal business hours or [phone] after hours for any problems. Including the following:  - excessive redness of the incisions - drainage for more than 4 days - fever of more than 101.5 F  *Please note that pain medications will not be refilled after hours or on weekends.

## 2015-04-26 NOTE — Progress Notes (Signed)
   PATIENT ID: Clearnce Sorrelonna A [place]   3 Days Post-Op Procedure(s) (LRB): INTRAMEDULLARY (IM) NAIL FEMORAL (Right)  Subjective: Doing well. Sitting up in chair. Improving with PT. Feels like she is ready to go home.   Objective:  Filed Vitals:   04/26/15 0409  BP: 117/58  Pulse: 98  Temp: 98.2 F (36.8 C)  Resp: 16     R hip with dressing c/d/i Wiggles toes, distally NVI  Labs:   Recent Labs  04/23/15 1904 04/24/15 0347 04/25/15 0627  HGB 12.8 10.9* 11.1*   Recent Labs  04/24/15 0347 04/25/15 0627  WBC 11.1* 8.9  RBC 3.87 3.80*  HCT 33.8* 33.2*  PLT 369 318   Recent Labs  04/24/15 0347 04/25/15 0852  NA 135 134*  K 4.8 4.6  CL 99* 97*  CO2 26 26  BUN 19 18  CREATININE 1.18* 1.00  GLUCOSE 426* 334*  CALCIUM 8.8* 9.2    Assessment and Plan: 3 days s/p right IM femoral nail  WBAT Up with OT/PT today Goal of po norco for pain control, script in chart D/c home likely today per primary team, okay to d/c per ortho standpoint VTE proph: ASA 325mg  BID, SCDs

## 2015-04-26 NOTE — Progress Notes (Signed)
Inpatient Diabetes Program Recommendations  AACE/ADA: New Consensus Statement on Inpatient Glycemic Control (2015)  Target Ranges:  Prepandial:   less than 140 mg/dL      Peak postprandial:   less than 180 mg/dL (1-2 hours)      Critically ill patients:  140 - 180 mg/dL   Review of Glycemic Control  Inpatient Diabetes Program Recommendations: Insulin - Basal: Fasting glucose 207 still after 30 units of Levemir. Please increase basal insulin to 36 units QHS.  Thanks,  Christena DeemShannon Shelonda Saxe RN, MSN, [facility] Inpatient Diabetes Coordinator Team Pager [phone] (8a-5p)

## 2015-04-26 NOTE — Discharge Summary (Signed)
Family Medicine Teaching [facility] Discharge Summary  Patient name: Marissa Leach Medical record number: [id number] Date of birth: [birth year] Age: 64 y.o. Gender: female Date of Admission: 04/23/2015  Date of Discharge: 04/26/2015  Admitting Physician: Hillary Bow, DO  Primary Care Provider: JEFFERY,CHELLE, PA-C Consultants: Orthopedics   Indication for Hospitalization: Right Comminuted intertrochanteric hip fracture   Discharge Diagnoses/Problem List:  T2DM  HTN  HLD   Disposition: Home   Discharge Condition: Stable   Discharge Exam:  Physical Exam: General: NAD, sitting in a chair  Cardiovascular: RRR, no murmurs, rubs, gallops  Respiratory: CTAB, no wheezes, rhonchi Abdomen: BS+, no ttp, no rebound  Extremities: Right hip sites without infection.  Brief Hospital Course:  Patient was admitted for Right Comminuted intertrochanteric hip fracture. Orthopedics performed surgery on 10/23. Patient received PT and OT while the hospital with necessary home equipment prescribed. Patient was to receive 21 days of aspirin 325 mg  BID, as well as Norco for pain management.   During hospitalization, it was noted that patient's CBGs were elevated in the 300s. Patient stated that her blood sugars had not been well controlled since patient had surgery on her right should in the begin of August. Therefore, patient's diabetes medications were titrated and changes are discussed bellow in issues for follow up.   Issues for Follow Up:  1.  Stop Glipizide 2.  Will start patient on higher dose of Lantus 30 units at night time  3.  Will also start patient on meal time insulin of 6-8 units, patient instructed to use of sliding scale of 8 units if her CBGs are 250 or greater before meals  4.  Instructed to check blood sugar in the morning, and before meals daily  5.  Can recheck CBC, slight decreased hgb, which was trending up follow surgery.  6. Patient receiving home health PT and  nursing, check on the status of this.  7. Patient to continue 325 mg ASA BID    Significant Procedures: None   Significant Labs and Imaging:   Recent Labs Lab 04/23/15 1904 04/24/15 0347 04/25/15 0627  WBC 12.8* 11.1* 8.9  HGB 12.8 10.9* 11.1*  HCT 39.1 33.8* 33.2*  PLT 309 369 318    Recent Labs Lab 04/23/15 1904 04/24/15 0347 04/25/15 0852  NA 134* 135 134*  K 4.5 4.8 4.6  CL 101 99* 97*  CO2 21* 26 26  GLUCOSE 345* 426* 334*  BUN CREATININE 1.01* 1.18* 1.00  CALCIUM 9.4 8.8* 9.2    Dg Chest 1 View  04/23/2015  CLINICAL DATA:  Preop for right hip fracture. EXAM: CHEST 1 VIEW COMPARISON:  None. FINDINGS: The heart size and mediastinal contours are within normal limits. Both lungs are clear. The visualized skeletal structures are unremarkable. IMPRESSION: No acute cardiopulmonary abnormality seen. Electronically Signed   By: Lupita Raider, M.D.   On: 04/23/2015 18:45   Dg Hip Operative Unilat With Pelvis Right  04/23/2015  CLINICAL DATA:  64 year old female status post surgical surgical repair for comminuted intertrochanteric hip fracture. EXAM: RIGHT FEMUR PORTABLE 1 VIEW; OPERATIVE RIGHT HIP WITH PELVIS COMPARISON:  04/23/2015. FINDINGS: Multiple intraoperative fluoroscopic views demonstrate interval placement of a intra medullary rod and gamma nail fixation device traversing the previously noted comminuted intertrochanteric hip fracture. Restoration of near anatomic alignment has been achieved, with some mild persistent medial displacement of the lesser trochanteric fragment. Postoperative films confirm the above findings and demonstrates some gas in the overlying  soft tissues and skin staples lateral to the hip joint. IMPRESSION: 1. Intraoperative documentation and postoperative failed demonstrating interval ORIF for comminuted intertrochanteric hip fracture with restoration of near anatomic alignment, as above. Electronically Signed   By: Trudie Reedaniel  Entrikin M.D.    On: 04/23/2015 23:13   Dg Hip Unilat  With Pelvis 2-3 Views Right  04/23/2015  CLINICAL DATA:  64 year old female with right-sided hip pain after falling over a pole in the ground at a birthday party this afternoon. Unable to move leg. EXAM: DG HIP (WITH OR WITHOUT PELVIS) 2-3V RIGHT COMPARISON:  No priors. FINDINGS: Comminuted intertrochanteric fracture of the right proximal femur with some distraction of fracture fragments and mild varus rotation (less than 10 degrees) of the right hip. Lesser trochanter fragment is approximately 12 mm medially displaced. Right femoral head remains properly located. Bony pelvis appears intact, as does the visualized portions of the left proximal femur. IMPRESSION: 1. Comminuted mildly displaced and angulated intertrochanteric fracture of the right hip, as above. Electronically Signed   By: Trudie Reedaniel  Entrikin M.D.   On: 04/23/2015 18:45   Dg Femur Port, 1v Right  04/23/2015  CLINICAL DATA:  64 year old female status post surgical surgical repair for comminuted intertrochanteric hip fracture. EXAM: RIGHT FEMUR PORTABLE 1 VIEW; OPERATIVE RIGHT HIP WITH PELVIS COMPARISON:  04/23/2015. FINDINGS: Multiple intraoperative fluoroscopic views demonstrate interval placement of a intra medullary rod and gamma nail fixation device traversing the previously noted comminuted intertrochanteric hip fracture. Restoration of near anatomic alignment has been achieved, with some mild persistent medial displacement of the lesser trochanteric fragment. Postoperative films confirm the above findings and demonstrates some gas in the overlying soft tissues and skin staples lateral to the hip joint. IMPRESSION: 1. Intraoperative documentation and postoperative failed demonstrating interval ORIF for comminuted intertrochanteric hip fracture with restoration of near anatomic alignment, as above. Electronically Signed   By: Trudie Reedaniel  Entrikin M.D.   On: 04/23/2015 23:13    Results/Tests Pending at Time  of Discharge: None   Discharge Medications:    Medication List    STOP taking these medications        aspirin 81 MG tablet  Replaced by:  aspirin EC 325 MG tablet     glipiZIDE 10 MG tablet  Commonly known as:  GLUCOTROL      TAKE these medications        aspirin EC 325 MG tablet  Take 1 tablet (325 mg total) by mouth 2 (two) times daily.     Co Q-10 400 MG Caps  Take 1 capsule by mouth daily.     fenofibrate 160 MG tablet  TAKE 1 TABLET (160 MG TOTAL) BY MOUTH DAILY     HYDROcodone-acetaminophen 5-325 MG tablet  Commonly known as:  NORCO  Take 1-2 tablets by mouth every 4 (four) hours as needed for moderate pain.     insulin aspart 100 UNIT/ML injection  Commonly known as:  novoLOG  Inject 6 Units into the skin as directed. Please take 6 units of insulin with meals. If only 2 meals a day, then twice daily     Insulin Detemir 100 UNIT/ML Pen  Commonly known as:  LEVEMIR FLEXPEN  Inject 30 Units into the skin daily at 10 pm.     KRILL OIL PO  Take 500 mg by mouth daily.     lisinopril-hydrochlorothiazide 20-12.5 MG tablet  Commonly known as:  PRINZIDE,ZESTORETIC  TAKE 1 TABLET BY MOUTH DAILY.     OMEGA-3 FISH OIL PO  Take 1,000 mg by mouth daily.     polyethylene glycol powder powder  Commonly known as:  MIRALAX  Take 17 g by mouth once.     sitaGLIPtin-metformin 50-1000 MG tablet  Commonly known as:  JANUMET  Take 1 tablet twice a day with meals.        Discharge Instructions: Please refer to Patient Instructions section of EMR for full details.  Patient was counseled important signs and symptoms that should prompt return to medical care, changes in medications, dietary instructions, activity restrictions, and follow up appointments.   Follow-Up Appointments:     Follow-up Information    Follow up with [facility].   Why:  Someone from [facility] will contact you concerning start date and time for therapy.   Contact  information:   [address] [place] [address] [phone]       Follow up with JEFFERY,CHELLE, PA-C. Schedule an appointment as soon as possible for a visit in 3 days.   Specialty:  Family Medicine   Why:  Hospital follow-up   Contact information:   [address] [place] [address] [phone]       Berton Bon, MD 04/26/2015, 4:20 PM PGY-1, [facility]

## 2015-04-26 NOTE — Progress Notes (Addendum)
Family Medicine Teaching Service Daily Progress Note Intern Pager: [phone]  Patient name: Marissa Leach Medical record number: [id number] Date of birth: 1951-05-16 Age: 64 y.o. Gender: female  Primary Care Provider: JEFFERY,CHELLE, PA-C Consultants: Orthopedics  Code Status: Full   Pt Overview and Major Events to Date:   Assessment and Plan:  #Right Closed Intertrochanter Femoral Fracture: Patient admitted for fracture following a fall.  Hip X-RAY showing Intraoperative documentation and postoperative failed demonstrating interval ORIF for comminuted intertrochanteric hip fracture with restoration of near anatomic alignment.  - Continue ASA 325 mg BID and SCDs - OT/PT - wants home health (PT and nursing) and walker  - currently on Norco 1 tablet, every 6 hours--> Per Ortho should go home of Percocet (script noted in chart, along with BID aspirin) - D/c home today   #T2DM:. HA1C 12.2 Home Insulin Regiment includes Levemir 20 units, Glipizide, Janumet,  - Levemir 30 units, Novolog 6 units TID with meals, SSI moderate - Stop glipizide - continue Janumet   - Today discussed diabetes regiment changes, patient was able to teach back and feels comfortable with changes to regiment.   #HTN: BP 117/58 - Continue home HCTZ- Lisinopril   #HLD, allergies Crestor and Lipitor  - Fenofibrate   FEN/GI: Diet Carb modified  PPx: SCDs and Aspirin 325 mg BID   Disposition: Home health with PT/ Skilled Nursing at discharge   Subjective:  Patient feels ready for discharge. Still has pain with movement. However feels that this well controlled with medication.    Objective: Temp:  [98.2 F (36.8 C)-99.6 F (37.6 C)] 98.2 F (36.8 C) (10/26 0409) Pulse Rate:  [98-106] 98 (10/26 0409) Resp:  [16] 16 (10/26 0409) BP: (111-117)/(49-65) 117/58 mmHg (10/26 0409) SpO2:  [95 %] 95 % (10/26 0409) Physical Exam: General: NAD, sitting in a chair  Cardiovascular: RRR, no murmurs, rubs, gallops   Respiratory: CTAB, no wheezes, rhonchi Abdomen: BS+, no ttp, no rebound  Extremities: Right hip sites without infection.  Laboratory:  Recent Labs Lab 04/23/15 1904 04/24/15 0347 04/25/15 0627  WBC 12.8* 11.1* 8.9  HGB 12.8 10.9* 11.1*  HCT 39.1 33.8* 33.2*  PLT 309 369 318    Recent Labs Lab 04/23/15 1904 04/24/15 0347 04/25/15 0852  NA 134* 135 134*  K 4.5 4.8 4.6  CL 101 99* 97*  CO2 21* 26 26  BUN CREATININE 1.01* 1.18* 1.00  CALCIUM 9.4 8.8* 9.2  GLUCOSE 345* 426* 334*     Imaging/Diagnostic Tests:  Dg Chest 1 View  04/23/2015  CLINICAL DATA:  Preop for right hip fracture. EXAM: CHEST 1 VIEW COMPARISON:  None. FINDINGS: The heart size and mediastinal contours are within normal limits. Both lungs are clear. The visualized skeletal structures are unremarkable. IMPRESSION: No acute cardiopulmonary abnormality seen. Electronically Signed   By: Lupita Raider, M.D.   On: 04/23/2015 18:45   Dg Hip Operative Unilat With Pelvis Right  04/23/2015  CLINICAL DATA:  64 year old female status post surgical surgical repair for comminuted intertrochanteric hip fracture. EXAM: RIGHT FEMUR PORTABLE 1 VIEW; OPERATIVE RIGHT HIP WITH PELVIS COMPARISON:  04/23/2015. FINDINGS: Multiple intraoperative fluoroscopic views demonstrate interval placement of a intra medullary rod and gamma nail fixation device traversing the previously noted comminuted intertrochanteric hip fracture. Restoration of near anatomic alignment has been achieved, with some mild persistent medial displacement of the lesser trochanteric fragment. Postoperative films confirm the above findings and demonstrates some gas in the overlying soft tissues and skin  staples lateral to the hip joint. IMPRESSION: 1. Intraoperative documentation and postoperative failed demonstrating interval ORIF for comminuted intertrochanteric hip fracture with restoration of near anatomic alignment, as above. Electronically Signed    By: Trudie Reedaniel  Entrikin M.D.   On: 04/23/2015 23:13   Dg Hip Unilat  With Pelvis 2-3 Views Right  04/23/2015  CLINICAL DATA:  64 year old female with right-sided hip pain after falling over a pole in the ground at a birthday party this afternoon. Unable to move leg. EXAM: DG HIP (WITH OR WITHOUT PELVIS) 2-3V RIGHT COMPARISON:  No priors. FINDINGS: Comminuted intertrochanteric fracture of the right proximal femur with some distraction of fracture fragments and mild varus rotation (less than 10 degrees) of the right hip. Lesser trochanter fragment is approximately 12 mm medially displaced. Right femoral head remains properly located. Bony pelvis appears intact, as does the visualized portions of the left proximal femur. IMPRESSION: 1. Comminuted mildly displaced and angulated intertrochanteric fracture of the right hip, as above. Electronically Signed   By: Trudie Reedaniel  Entrikin M.D.   On: 04/23/2015 18:45   Dg Femur Port, 1v Right  04/23/2015  CLINICAL DATA:  64 year old female status post surgical surgical repair for comminuted intertrochanteric hip fracture. EXAM: RIGHT FEMUR PORTABLE 1 VIEW; OPERATIVE RIGHT HIP WITH PELVIS COMPARISON:  04/23/2015. FINDINGS: Multiple intraoperative fluoroscopic views demonstrate interval placement of a intra medullary rod and gamma nail fixation device traversing the previously noted comminuted intertrochanteric hip fracture. Restoration of near anatomic alignment has been achieved, with some mild persistent medial displacement of the lesser trochanteric fragment. Postoperative films confirm the above findings and demonstrates some gas in the overlying soft tissues and skin staples lateral to the hip joint. IMPRESSION: 1. Intraoperative documentation and postoperative failed demonstrating interval ORIF for comminuted intertrochanteric hip fracture with restoration of near anatomic alignment, as above. Electronically Signed   By: Trudie Reedaniel  Entrikin M.D.   On: 04/23/2015 23:13     Asiyah Mayra ReelZahra Mikell, MD 04/26/2015, 10:01 AM PGY-1, [facility] FPTS Intern pager: [phone], text pages welcome

## 2015-04-26 NOTE — Plan of Care (Addendum)
Problem: Food- and Nutrition-Related Knowledge Deficit (NB-1.1) Goal: Nutrition education Formal process to instruct or train a patient/client in a skill or to impart knowledge to help patients/clients voluntarily manage or modify food choices and eating behavior to maintain or improve health. Outcome: Completed/Met Date Met:  04/26/15  RD consulted for nutrition education regarding diabetes.     Lab Results  Component Value Date    HGBA1C 11.9* 04/25/2015    RD provided "Carbohydrate Counting for People with Diabetes" handout from the Academy of [employer]. Pt reports she is familiar with the diet, however since shoulder surgery pt has not been following the diet. Pt reports she will follow her diabetic diet once post discharged. Discussed different food groups and their effects on blood sugar, emphasizing carbohydrate-containing foods. Provided list of carbohydrates and recommended serving sizes of common foods.  Discussed importance of controlled and consistent carbohydrate intake throughout the day. Provided examples of ways to balance meals/snacks and encouraged intake of high-fiber, whole grain complex carbohydrates. Discussed diabetic friendly drink options. Teach back method used.  Expect good compliance.   Pt continues to have a lack of appetite. Meal completion has been 25-30%. Pt has been consuming her Glucerna Shake. RD to continue with current orders. Plans for possible d/c this afternoon. Pt was encouraged to continue supplementation at home is po intake continues to be poor. Pt expressed understanding.  Corrin Parker, MS, RD, LDN Pager # [phone] After hours/ weekend pager # [phone]

## 2015-04-28 ENCOUNTER — Telehealth: Payer: Self-pay

## 2015-04-28 DIAGNOSIS — IMO0001 Reserved for inherently not codable concepts without codable children: Secondary | ICD-10-CM

## 2015-04-28 DIAGNOSIS — E1165 Type 2 diabetes mellitus with hyperglycemia: Principal | ICD-10-CM

## 2015-04-28 NOTE — Telephone Encounter (Signed)
Chelle or Mani   Pt just out of Hospital with broken hip.   Needs a phone call and accu check viva is broken, needs a new one just like the machine has.   [phone]

## 2015-04-28 NOTE — Telephone Encounter (Signed)
Chelle,  Her chart says your her PCP, please see previous message Please advise

## 2015-04-30 NOTE — Telephone Encounter (Signed)
1. Please call this patient.   2. Please schedule a hospital follow-up with me.  3. I'll order the glucometer. It will print (DME). Please fax to her pharmacy.

## 2015-05-01 NOTE — Telephone Encounter (Signed)
Left message on machine to call back  

## 2015-05-01 NOTE — Telephone Encounter (Signed)
Pt is still immobile and is unable to come in.

## 2015-05-01 NOTE — Telephone Encounter (Signed)
I have faxed Rx for glucometer to pt's pharm.

## 2015-05-04 ENCOUNTER — Telehealth: Payer: Self-pay

## 2015-05-04 MED ORDER — "INSULIN SYRINGE-NEEDLE U-100 31G X 5/16"" 0.3 ML MISC": Status: AC

## 2015-05-04 NOTE — Telephone Encounter (Signed)
Left message for pt to call back  °

## 2015-05-04 NOTE — Telephone Encounter (Signed)
Pt states that she ws in the hospital for a broke hip recently and while she was in they changed her diabetes meds to novalog and she is needing needles to administer the medication   Best number [phone]

## 2015-05-04 NOTE — Telephone Encounter (Signed)
Pt missed her CB. She would like to use [facility] on [address] Please CB at [phone].

## 2015-05-04 NOTE — Telephone Encounter (Signed)
Advised pt we will send in syringes.  Syringes sent in

## 2015-05-09 ENCOUNTER — Emergency Department (HOSPITAL_COMMUNITY)
Admission: EM | Admit: 2015-05-09 | Discharge: 2015-06-01 | Disposition: E | Payer: Commercial Managed Care - HMO | Attending: Emergency Medicine | Admitting: Emergency Medicine

## 2015-05-09 DIAGNOSIS — E119 Type 2 diabetes mellitus without complications: Secondary | ICD-10-CM | POA: Diagnosis not present

## 2015-05-09 DIAGNOSIS — Z79899 Other long term (current) drug therapy: Secondary | ICD-10-CM | POA: Diagnosis not present

## 2015-05-09 DIAGNOSIS — I469 Cardiac arrest, cause unspecified: Secondary | ICD-10-CM | POA: Diagnosis not present

## 2015-05-09 DIAGNOSIS — Z7982 Long term (current) use of aspirin: Secondary | ICD-10-CM | POA: Insufficient documentation

## 2015-05-09 DIAGNOSIS — Z794 Long term (current) use of insulin: Secondary | ICD-10-CM | POA: Insufficient documentation

## 2015-05-09 DIAGNOSIS — I10 Essential (primary) hypertension: Secondary | ICD-10-CM | POA: Insufficient documentation

## 2015-05-09 DIAGNOSIS — E785 Hyperlipidemia, unspecified: Secondary | ICD-10-CM | POA: Diagnosis not present

## 2015-05-09 MED ORDER — EPINEPHRINE HCL 0.1 MG/ML IJ SOSY
PREFILLED_SYRINGE | INTRAMUSCULAR | Status: AC | PRN
  Administered 2015-05-09 (×5): 1 via INTRAVENOUS

## 2015-05-09 MED ORDER — TENECTEPLASE 50 MG IV KIT: 50.0000 mg/h | PACK | Freq: Once | INTRAVENOUS | Status: DC

## 2015-05-09 MED ORDER — TENECTEPLASE 50 MG IV KIT
50.0000 mg | PACK | Freq: Once | INTRAVENOUS | Status: AC
  Administered 2015-05-09: 50 mg via INTRAVENOUS
  Filled 2015-05-09: qty 10

## 2015-05-16 ENCOUNTER — Telehealth: Payer: Self-pay

## 2015-05-16 NOTE — Telephone Encounter (Signed)
Patient passed away on 05/18/2015. Patient's friend called to notify us.

## 2015-06-01 NOTE — ED Notes (Signed)
Pt having more family members arrive at present. Family at bedside.

## 2015-06-01 NOTE — ED Provider Notes (Signed)
CSN: [id number]     Arrival date & time 05/26/15  1232 History   First MD Initiated Contact with Patient May 26, 2015 1301     Chief Complaint  Patient presents with  . Cardiac Arrest     (Consider location/radiation/quality/duration/timing/severity/associated sxs/prior Treatment) HPI Comments: Pt presents in cardiac arrest.  She has a hx of HTN and DM.  She has recent hip surgery for an intertrochanteric fracture about 2 weeks ago.  Per EMS, she was at PT today and had a syncopal episdoe.  She initially did not want transport to ED, but became hypotensive on standing.  PT was brought to stretcher and was complaining of SOB.  They later down on the stretcher and started giving IV fluids. She was still hypotensive after the first 500 cc. Shortly after this, she started becoming more bradycardic and then went into cardiac arrest per EMS. She was placed on the [place]. She was intubated in the field. She was given a total of 9 mg of epinephrine. She reportedly was in P EA for the entire time.   Past Medical History  Diagnosis Date  . Hypertension     resolved by wt loss  . Diabetes mellitus   . Hyperlipidemia   . Microhematuria    Past Surgical History  Procedure Laterality Date  . Cholecystectomy    . Tubal ligation    . Hand surgery Right age 73    cyst in palm  . Shoulder arthroscopy with subacromial decompression Right 02/20/2015    Procedure: RIGHT SHOULDER ARTHROSCOPY WITH ROTATOR CUFF DEBRIDEMENT ,,SUBACROMIAL DECOMPRESSION, DISTAL CLAVICAL EXCISION, CAPSULAR RELEASE;  Surgeon: Jones Broom, MD;  Location: [facility];  Service: Orthopedics;  Laterality: Right;  . Shoulder arthroscopy with bicepstenotomy Right 02/20/2015    Procedure: SHOULDER ARTHROSCOPY WITH BICEPSTENOTOMY;  Surgeon: Jones Broom, MD;  Location: [facility];  Service: Orthopedics;  Laterality: Right;  . Resection distal clavical Right 02/20/2015    Procedure: RESECTION DISTAL CLAVICAL;   Surgeon: Jones Broom, MD;  Location: [facility];  Service: Orthopedics;  Laterality: Right;  . Capsular release Right 02/20/2015    Procedure: CAPSULAR RELEASE;  Surgeon: Jones Broom, MD;  Location: [facility];  Service: Orthopedics;  Laterality: Right;  . Femur im nail Right 04/23/2015    Procedure: INTRAMEDULLARY (IM) NAIL FEMORAL;  Surgeon: Jones Broom, MD;  Location: [facility];  Service: Orthopedics;  Laterality: Right;   Family History  Problem Relation Age of Onset  . Heart disease Mother   . Heart disease Father   . Diabetes Son 15   Social History  Substance Use Topics  . Smoking status: Never Smoker   . Smokeless tobacco: Never Used  . Alcohol Use: No   OB History    No data available     Review of Systems  Unable to perform ROS: Patient unresponsive      Allergies  Crestor and Lipitor  Home Medications   Prior to Admission medications   Medication Sig Start Date End Date Taking? Authorizing Provider  aspirin EC 325 MG tablet Take 1 tablet (325 mg total) by mouth 2 (two) times daily. 04/24/15   Jiles Harold, PA-C  Coenzyme Q10 (CO Q-10) 400 MG CAPS Take 1 capsule by mouth daily.    Historical Provider, MD  fenofibrate 160 MG tablet TAKE 1 TABLET (160 MG TOTAL) BY MOUTH DAILY 02/27/15   Chelle Jeffery, PA-C  HYDROcodone-acetaminophen (NORCO) 5-325 MG tablet Take 1-2 tablets by mouth every 4 (  four) hours as needed for moderate pain. 04/24/15   Jiles Haroldanielle Laliberte, PA-C  insulin aspart (NOVOLOG) 100 UNIT/ML injection Inject 6 Units into the skin as directed. Please take 6 units of insulin with meals. If only 2 meals a day, then twice daily 04/26/15   Asiyah Mayra ReelZahra Mikell, MD  Insulin Detemir (LEVEMIR FLEXPEN) 100 UNIT/ML Pen Inject 30 Units into the skin daily at 10 pm. 04/26/15   Asiyah Mayra ReelZahra Mikell, MD  Insulin Syringe-Needle U-100 (B-D INSULIN SYRINGE) 31G X 5/16" 0.3 ML MISC Use as directed up to three times a day with Novolog  05/04/15   Tishira R Brewington, PA-C  KRILL OIL PO Take 500 mg by mouth daily.     Historical Provider, MD  lisinopril-hydrochlorothiazide (PRINZIDE,ZESTORETIC) 20-12.5 MG per tablet TAKE 1 TABLET BY MOUTH DAILY. 01/19/15   Chelle Jeffery, PA-C  Omega-3 Fatty Acids (OMEGA-3 FISH OIL PO) Take 1,000 mg by mouth daily.    Historical Provider, MD  polyethylene glycol powder (MIRALAX) powder Take 17 g by mouth once. 04/26/15   Asiyah Mayra ReelZahra Mikell, MD  sitaGLIPtin-metformin (JANUMET) 50-1000 MG per tablet Take 1 tablet twice a day with meals. Patient taking differently: Take 1 tablet by mouth 2 (two) times daily with a meal. Take 1 tablet twice a day with meals. 10/24/14   Wallis BambergMario Mani, PA-C   BP   Pulse 0  Resp   Wt 220 lb (99.791 kg)  SpO2 77% Physical Exam  Constitutional: She appears well-developed and well-nourished.  HENT:  Head: Normocephalic and atraumatic.  Eyes:  Pupils are nonreactive  Neck: Normal range of motion. Neck supple.  Cardiovascular:  No palpable pulses.  Pulmonary/Chest:  No spontaneous respirations, patient has an ET tube in place. There are good breath sounds on the right side with limited breath sounds on the left. No breath sounds over the epigastrium. Samuel BoucheLucas device is in place with compressions going.  Abdominal: Soft. Bowel sounds are normal.  Neurological:  Patient is unresponsive  Skin: Skin is dry. There is pallor.  Cold to the touch with cyanotic extremities    ED Course  Procedures (including critical care time) Labs Review Labs Reviewed - No data to display  Imaging Review No results found. I have personally reviewed and evaluated these images and lab results as part of my medical decision-making.   EKG Interpretation None      MDM   Final diagnoses:  Cardiac arrest [facility](HCC)    Patient has noted to be in PEA with a bradycardic rhythm. She was given epinephrine per ACLS protocol. CPR was [facility] with the [facility] device.  The ET tube appears to be  right mainstem but does appear to be in the trachea. End-tidal CO2's are ranging between 11 and 30. She also was given TNKase for possible pulmonary embolus as etiology for her arrest. The resuscitation was continued for approximately 30-40 minutes in the ED. She did not regain any pulses. She at this point was in asystole. There is no cardiac activity on ultrasound. The decision was made to stop resuscitation. I notified the family with the chaplain.  I also contacted the medical examiner on-call, Al Decantick Miller,  who did not feel the patient would be an ME case.  I contacted the pt's PCP at [facility], Porfirio Oarhelle Jeffery, who will sign death certificate.    Rolan BuccoMelanie Yanique Mulvihill, MD 12-02-14 [phone]

## 2015-06-01 NOTE — ED Notes (Addendum)
Per EMS- pt had recent hip replacement and was undergoing PT therapy. Pt had a syncopal episode during PT. Pt was unconscious for a few seconds. Pt was awake for EMS and was complaining of shortness of breath. Pt became unconscious and CPR was initiated by EMS. Pt bagged and placed on [place]. Given epi. Pt awoke during CPR and CPR was stopped. Pt then bradyed down and CPR was resumed. Arrived to trauma room with CPR in progress, EMS reports 15 minutes of CPR performed with a total of 9 Epi pushes and a 500ml Bolus. Pt intubated upon arrival.

## 2015-06-01 NOTE — ED Notes (Signed)
TNKase 50mg  given IVPush [facility]

## 2015-06-01 NOTE — Progress Notes (Signed)
   May 14, 2015 1400  Clinical Encounter Type  Visited With Family  Visit Type Death  Referral From Nurse  Spiritual Encounters  Spiritual Needs Grief support;Emotional;Prayer  Stress Factors  Patient Stress Factors Major life changes;Health changes  Responded to call from ED that family was there for patient in trauma B who had passed away. Worked with doctor to inform the family and stayed with them as they gathered and visited the body. Got information for staff.

## 2015-06-01 NOTE — ED Notes (Signed)
Family for pt in consultation room B. Chaplain has been paged.

## 2015-06-01 NOTE — ED Notes (Signed)
Chaplain is on her way to see the family now.

## 2015-06-01 NOTE — Progress Notes (Signed)
Patient arrived intubated by EMS and being manually ventilated. RT took over manual ventilation and connected capnography. RT continued to manually ventilate patient until the Doctor called the code. Patient had positive BS on the right but diminished on the left.

## 2015-06-01 NOTE — ED Notes (Signed)
Pt family has left

## 2015-06-01 DEATH — deceased

## 2017-01-27 IMAGING — CR DG CHEST 1V
1 series · 1 of 1 positions shown · non-contrast
Comparison: None.

CLINICAL DATA: Preop for right hip fracture.

EXAM:
CHEST 1 VIEW

[chest ap]
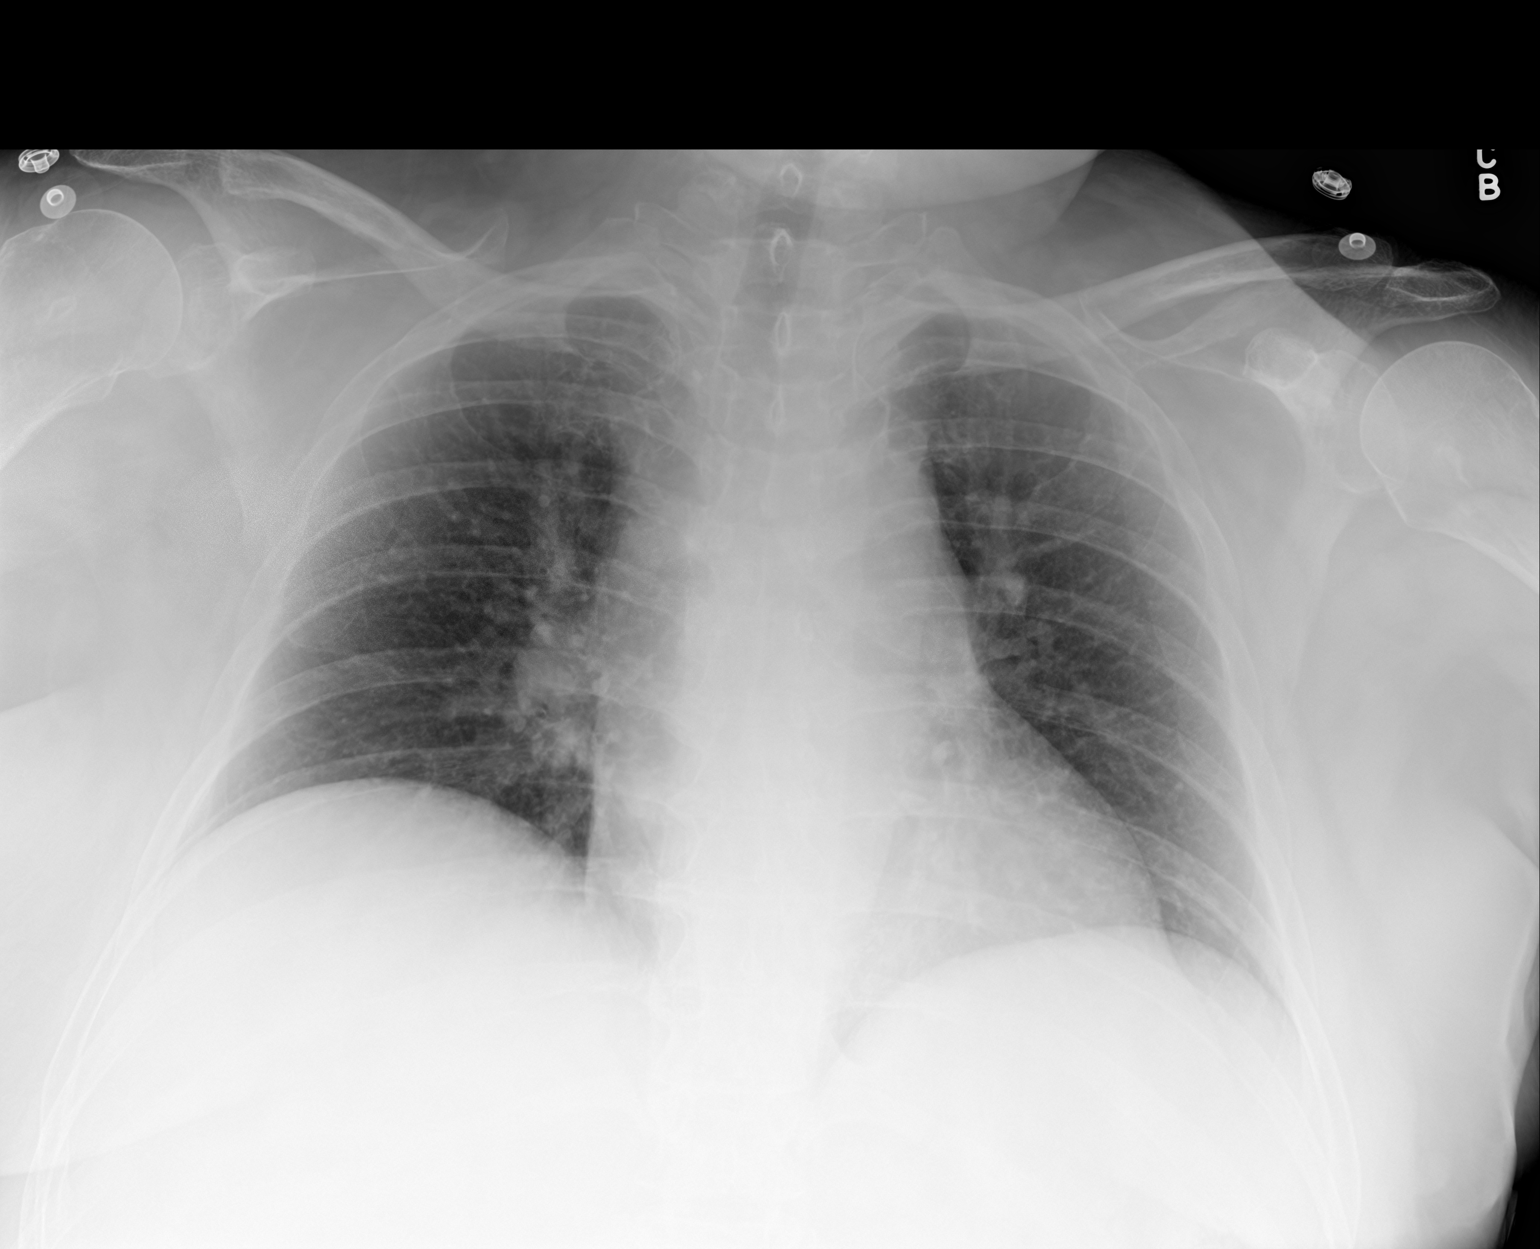

[1 of 1 positions shown; findings below may reference images not displayed]

FINDINGS: The heart size and mediastinal contours are within normal limits.
Both lungs are clear. The visualized skeletal structures are
unremarkable.
IMPRESSION: No acute cardiopulmonary abnormality seen.

## 2017-01-27 IMAGING — DX DG HIP (WITH OR WITHOUT PELVIS) 2-3V*R*
3 series · 3 of 3 positions shown · non-contrast
Comparison: No priors.

CLINICAL DATA: 63-year-old female with right-sided hip pain after
falling over a pole in the ground at a birthday party this
afternoon. Unable to move leg.

EXAM:
DG HIP (WITH OR WITHOUT PELVIS) 2-3V RIGHT

[pelvis ap]
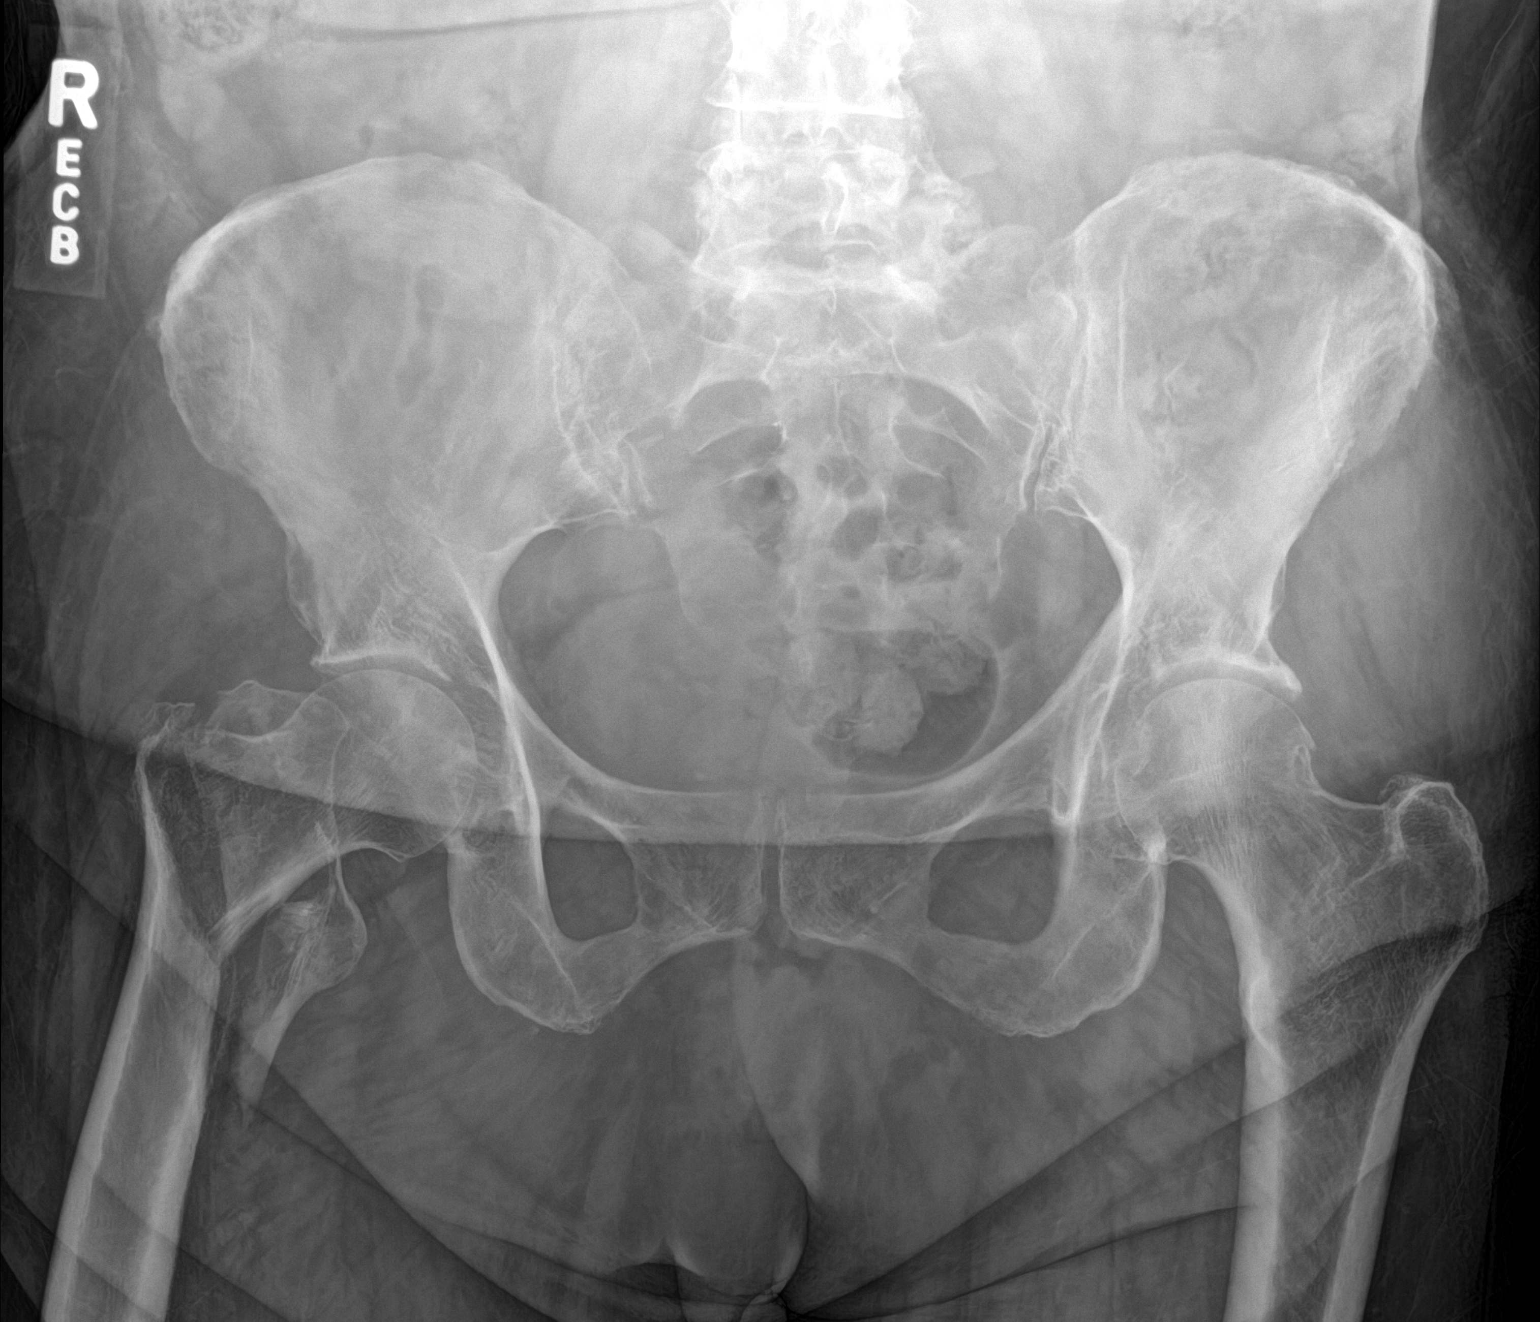

[hip ap]
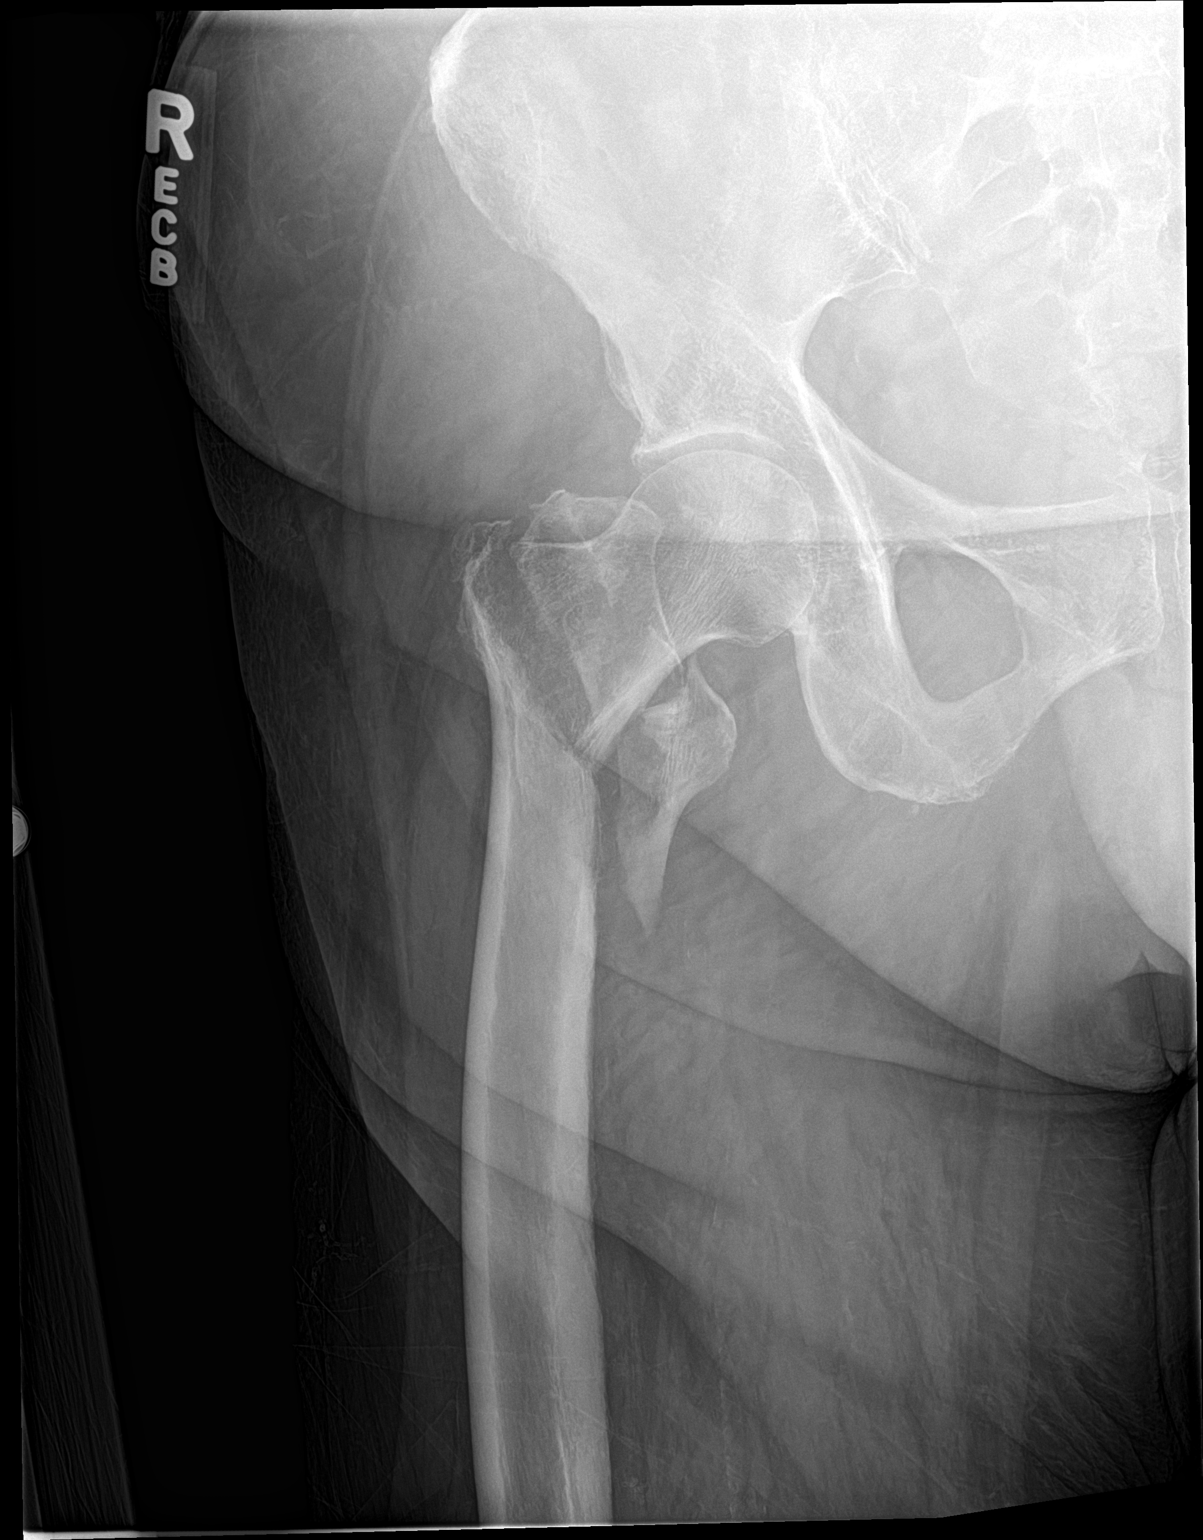

[hip x-table]
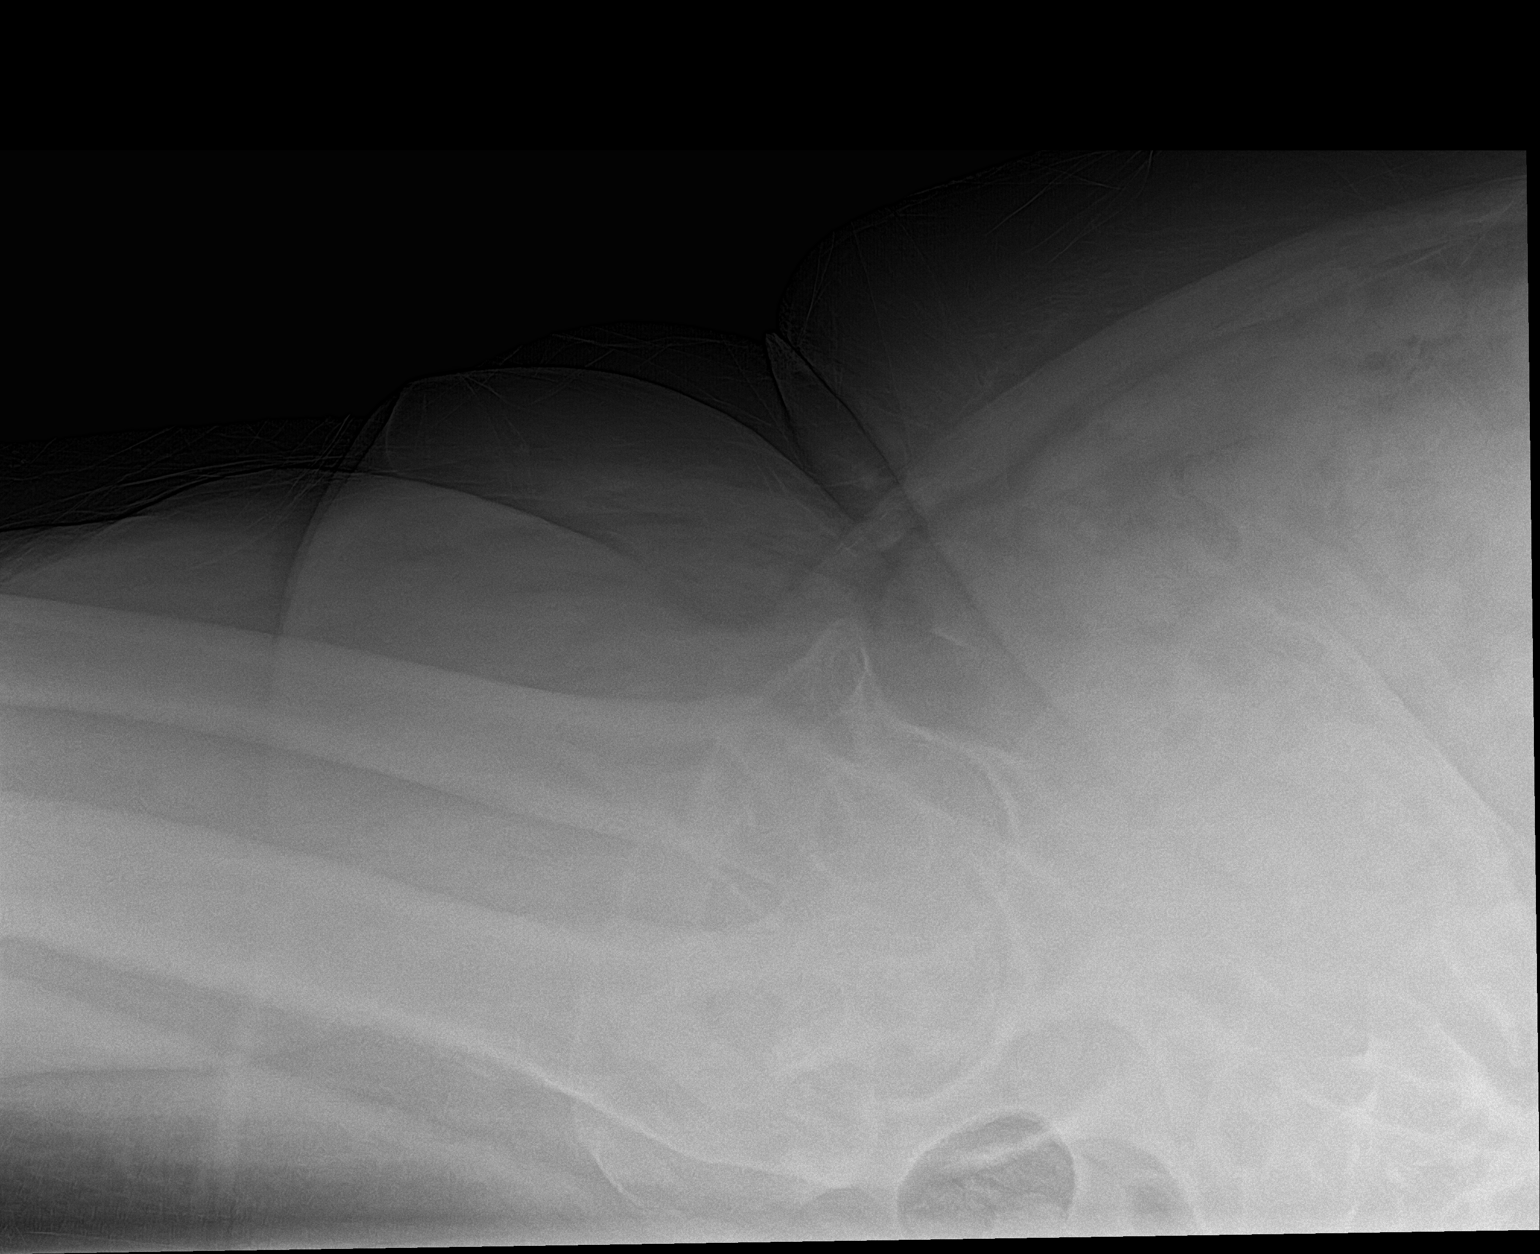

[3 of 3 positions shown; findings below may reference images not displayed]

FINDINGS: Comminuted intertrochanteric fracture of the right proximal femur
with some distraction of fracture fragments and mild varus rotation
(less than 10 degrees) of the right hip. Lesser trochanter fragment
is approximately 12 mm medially displaced. Right femoral head
remains properly located. Bony pelvis appears intact, as does the
visualized portions of the left proximal femur.
IMPRESSION: 1. Comminuted mildly displaced and angulated intertrochanteric
fracture of the right hip, as above.

## 2017-01-27 IMAGING — RF DG HIP (WITH PELVIS) OPERATIVE*R*
1 series · 6 of 6 positions shown · non-contrast
Comparison: 04/23/2015.

CLINICAL DATA: 63-year-old female status post surgical surgical
repair for comminuted intertrochanteric hip fracture.

EXAM:
RIGHT FEMUR PORTABLE 1 VIEW; OPERATIVE RIGHT HIP WITH PELVIS

[Series 1: run · 6 of 6 slices shown]
[im 1/6]
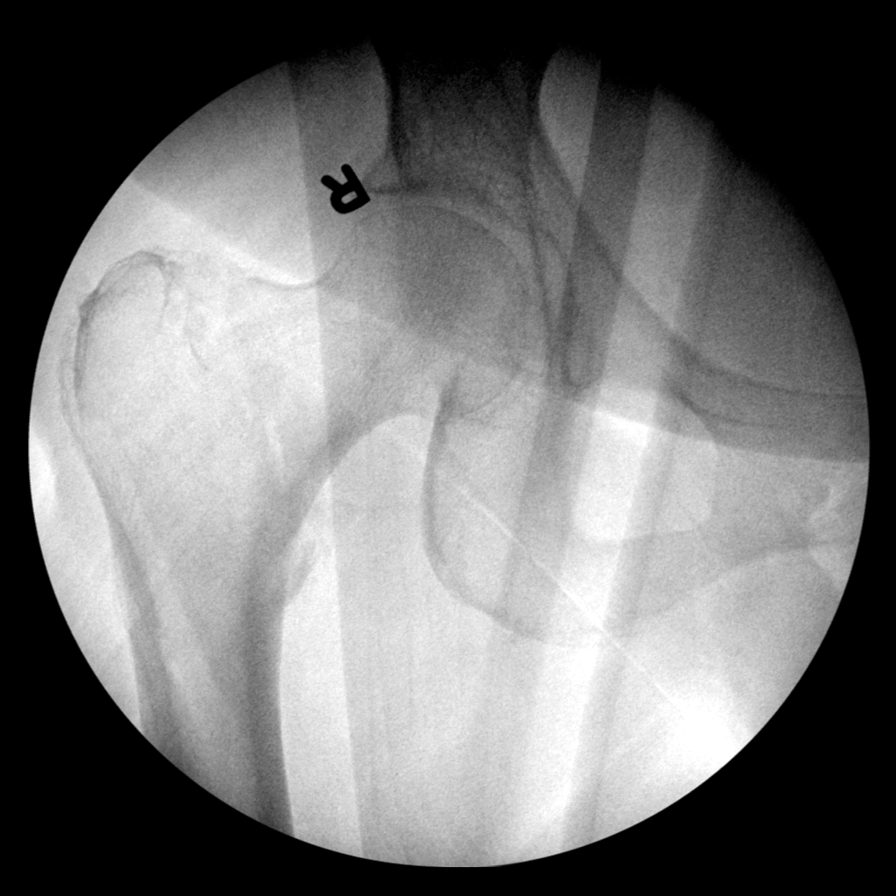
[im 2/6]
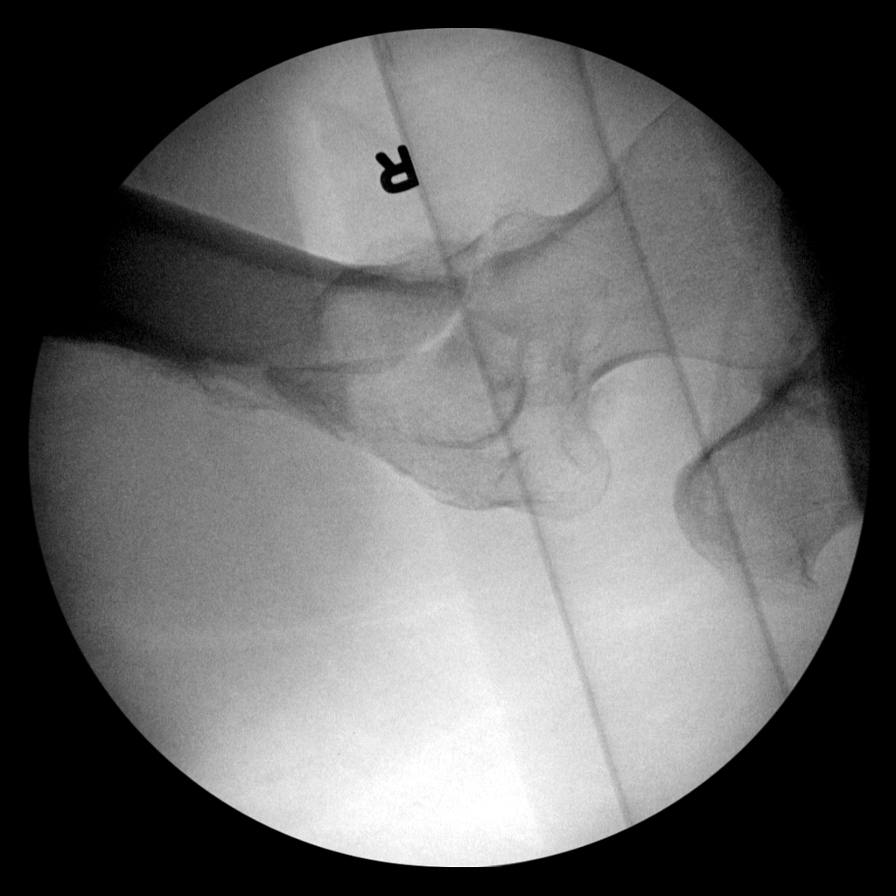
[im 3/6]
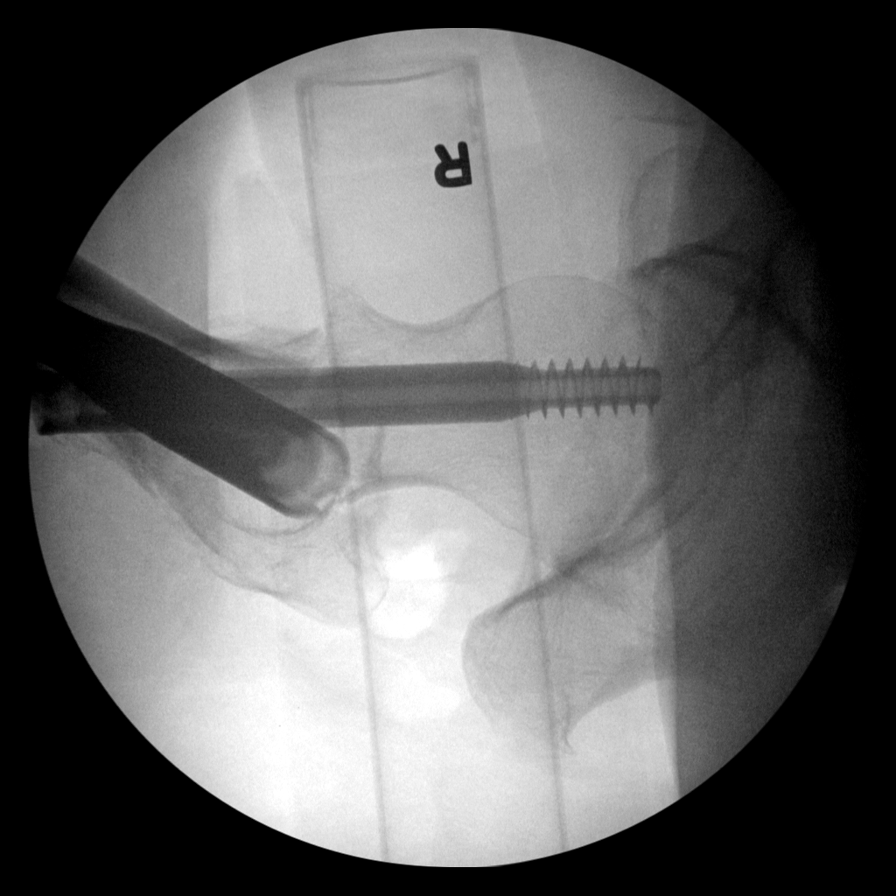
[im 4/6]
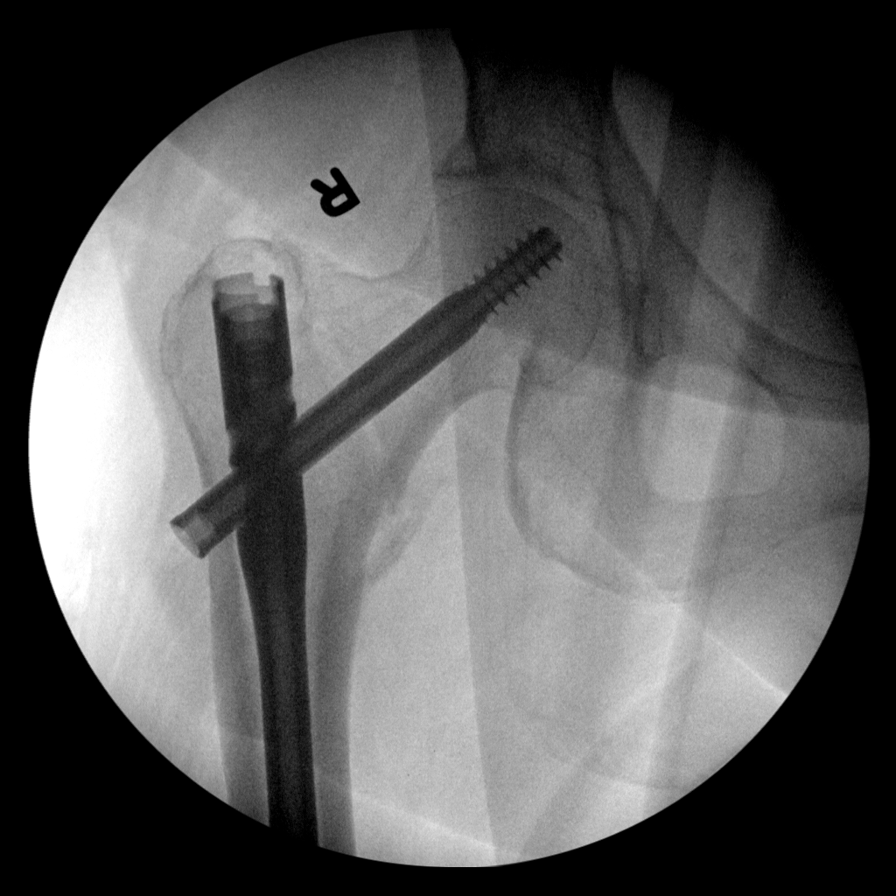
[im 5/6]
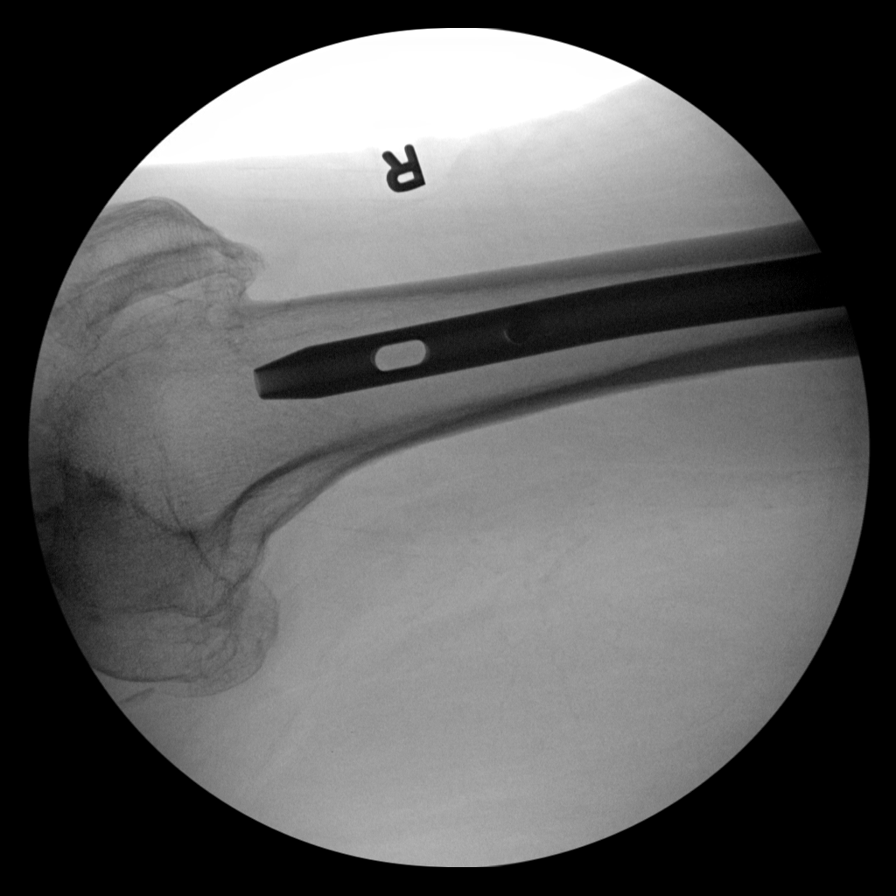
[im 6/6]
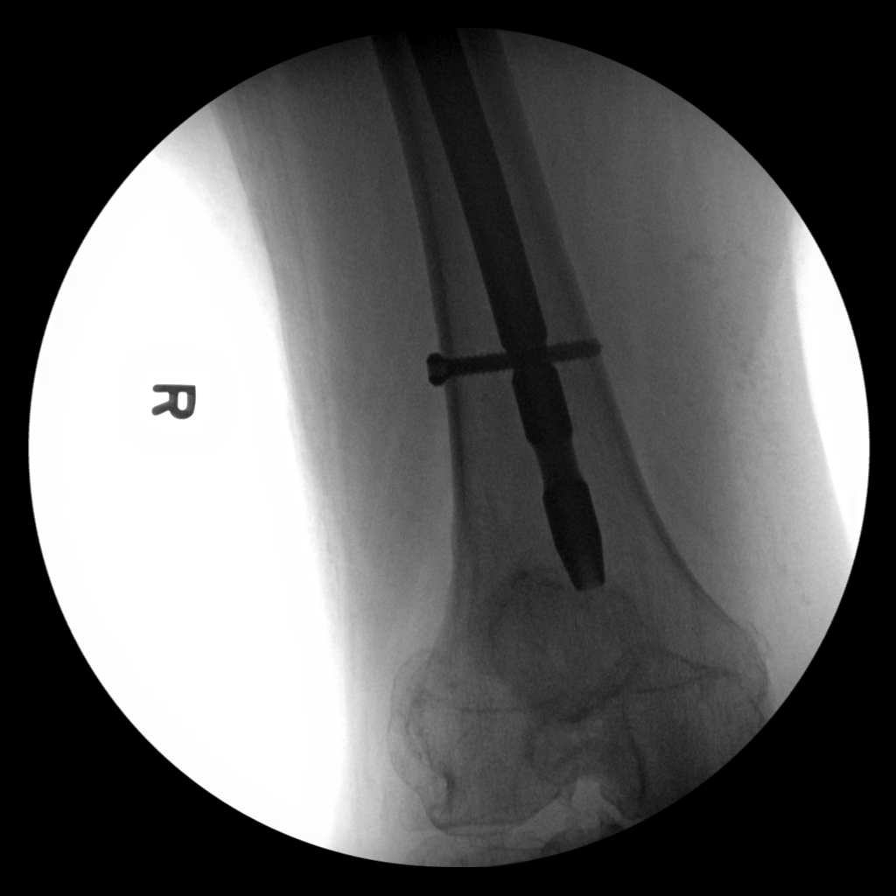

[6 of 6 positions shown; findings below may reference images not displayed]

FINDINGS: Multiple intraoperative fluoroscopic views demonstrate interval
placement of a intra medullary rod and gamma nail fixation device
traversing the previously noted comminuted intertrochanteric hip
fracture. Restoration of near anatomic alignment has been achieved,
with some mild persistent medial displacement of the lesser
trochanteric fragment. Postoperative films confirm the above
findings and demonstrates some gas in the overlying soft tissues and
skin staples lateral to the hip joint.
IMPRESSION: 1. Intraoperative documentation and postoperative failed
demonstrating interval ORIF for comminuted intertrochanteric hip
fracture with restoration of near anatomic alignment, as above.
